# Patient Record
Sex: Female | Born: 1965 | Race: White | Hispanic: No | Smoking: Never smoker
Health system: Southern US, Community
[De-identification: ages and names within clinical notes are randomized; demographics above are authoritative.]

## PROBLEM LIST (undated history)

## (undated) DIAGNOSIS — I10 Essential (primary) hypertension: Secondary | ICD-10-CM

## (undated) DIAGNOSIS — R197 Diarrhea, unspecified: Secondary | ICD-10-CM

## (undated) DIAGNOSIS — Z85820 Personal history of malignant melanoma of skin: Secondary | ICD-10-CM

## (undated) DIAGNOSIS — E119 Type 2 diabetes mellitus without complications: Secondary | ICD-10-CM

## (undated) DIAGNOSIS — M810 Age-related osteoporosis without current pathological fracture: Secondary | ICD-10-CM

## (undated) DIAGNOSIS — Z0000 Encounter for general adult medical examination without abnormal findings: Secondary | ICD-10-CM

## (undated) DIAGNOSIS — R42 Dizziness and giddiness: Secondary | ICD-10-CM

## (undated) HISTORY — PX: GASTRIC BYPASS: SHX52

## (undated) HISTORY — PX: CYST EXCISION: SHX5701

---

## 2011-01-01 ENCOUNTER — Inpatient Hospital Stay (INDEPENDENT_AMBULATORY_CARE_PROVIDER_SITE_OTHER)
Admission: RE | Admit: 2011-01-01 | Discharge: 2011-01-01 | Disposition: A | Discharge status: Home / Self Care | Payer: Self-pay | Source: Ambulatory Visit | Attending: Emergency Medicine | Admitting: Emergency Medicine

## 2011-01-01 DIAGNOSIS — L03319 Cellulitis of trunk, unspecified: Secondary | ICD-10-CM

## 2011-01-01 DIAGNOSIS — N390 Urinary tract infection, site not specified: Secondary | ICD-10-CM

## 2011-01-01 LAB — POCT URINALYSIS DIP (DEVICE)
Bilirubin Urine: NEGATIVE
Glucose, UA: NEGATIVE mg/dL
Ketones, ur: NEGATIVE mg/dL
Nitrite: NEGATIVE
Specific Gravity, Urine: 1.015 (ref 1.005–1.030)
pH: 7 (ref 5.0–8.0)

## 2011-01-03 LAB — URINE CULTURE
Colony Count: >=100000
Culture  Setup Time: 201208251719

## 2011-05-30 ENCOUNTER — Telehealth (HOSPITAL_COMMUNITY): Payer: Self-pay | Admitting: *Deleted

## 2011-05-30 ENCOUNTER — Emergency Department (INDEPENDENT_AMBULATORY_CARE_PROVIDER_SITE_OTHER)
Admission: EM | Admit: 2011-05-30 | Discharge: 2011-05-30 | Disposition: A | Discharge status: Home / Self Care | Payer: Commercial Managed Care - PPO | Source: Home / Self Care | Attending: Family Medicine | Admitting: Family Medicine

## 2011-05-30 ENCOUNTER — Encounter (HOSPITAL_COMMUNITY): Payer: Self-pay | Admitting: *Deleted

## 2011-05-30 DIAGNOSIS — J329 Chronic sinusitis, unspecified: Secondary | ICD-10-CM

## 2011-05-30 HISTORY — DX: Essential (primary) hypertension: I10

## 2011-05-30 MED ORDER — FLUTICASONE PROPIONATE 50 MCG/ACT NA SUSP: 2.0000 | Freq: Every day | NASAL | Status: DC

## 2011-05-30 MED ORDER — AMOXICILLIN-POT CLAVULANATE 875-125 MG PO TABS: 1.0000 | ORAL_TABLET | Freq: Two times a day (BID) | ORAL | Status: AC

## 2011-05-30 NOTE — ED Provider Notes (Addendum)
History     CSN: [id number]  Arrival date & time 05/30/11  1111   First MD Initiated Contact with Patient 05/30/11 1300      Chief Complaint  Patient presents with  . Sinusitis    (Consider location/radiation/quality/duration/timing/severity/associated sxs/prior treatment) HPI Comments: Sinus headache and pressure x 10 days not responding to Dayquil. Hx of sinus infections. No fever. Green nasal discharge. No cough. Patient is a 46 y.o. female presenting with sinusitis. The history is provided by the patient.  Sinusitis  This is a new problem. The current episode started more than 1 week ago. The problem has not changed since onset.There has been no fever. Associated symptoms include congestion and sinus pressure. She has tried other medications and acetaminophen for the symptoms.    Past Medical History  Diagnosis Date  . Hypertension     Past Surgical History  Procedure Date  . Gastric bypass     History reviewed. No pertinent family history.  History  Substance Use Topics  . Smoking status: Not on file  . Smokeless tobacco: Not on file  . Alcohol Use:     OB History    Grav Para Term Preterm Abortions TAB SAB Ect Mult Living                  Review of Systems  Constitutional: Negative.   HENT: Positive for congestion and sinus pressure.   Eyes: Negative.   Respiratory: Negative.   Cardiovascular: Negative.   Gastrointestinal: Negative.   Genitourinary: Negative.   Musculoskeletal: Negative.   Skin: Negative.   Neurological: Negative.     Allergies  Aspirin  Home Medications   Current Outpatient Rx  Name Route Sig Dispense Refill  . LISINOPRIL 5 MG PO TABS Oral Take 5 mg by mouth daily.    Marland Kitchen FLUTICASONE PROPIONATE 50 MCG/ACT NA SUSP Nasal Place 2 sprays into the nose daily. 16 g 2    BP 159/82  Pulse 73  Temp(Src) 98.4 F (36.9 C) (Oral)  Resp 16  SpO2 100%  LMP 05/23/2011  Physical Exam  Nursing note and vitals  reviewed. Constitutional: She is oriented to person, place, and time. She appears well-developed and well-nourished.  HENT:  Head: Normocephalic and atraumatic.  Right Ear: Tympanic membrane is retracted.  Left Ear: Tympanic membrane is retracted.  Mouth/Throat: Uvula is midline, oropharynx is clear and moist and mucous membranes are normal.  Eyes: EOM are normal.  Neck: Normal range of motion.  Pulmonary/Chest: Effort normal and breath sounds normal. She has no wheezes. She has no rhonchi.  Musculoskeletal: Normal range of motion.  Neurological: She is alert and oriented to person, place, and time.  Skin: Skin is warm and dry.  Psychiatric: Her behavior is normal.    ED Course  Procedures (including critical care time)  Labs Reviewed - No data to display No results found.   1. Sinusitis       MDM  Given rx for fluticasone and Augmentin        Richardo Priest, MD 06/25/11 4098  Richardo Priest, MD 06/25/11 2036

## 2011-05-30 NOTE — ED Notes (Signed)
Pt  Reports  Symptoms  Of  A  Frontal  Headache  With  Sinus  Congestion  And  Nasal  stuffyness  Which  She  Reports  She has  Had  For  About 10  Days  She  Has  Taken otc  meds  With  No  releif

## 2011-10-03 ENCOUNTER — Emergency Department (INDEPENDENT_AMBULATORY_CARE_PROVIDER_SITE_OTHER)
Admission: EM | Admit: 2011-10-03 | Discharge: 2011-10-03 | Disposition: A | Discharge status: Home Health | Payer: Commercial Managed Care - PPO | Source: Home / Self Care | Attending: Emergency Medicine | Admitting: Emergency Medicine

## 2011-10-03 ENCOUNTER — Encounter (HOSPITAL_COMMUNITY): Payer: Self-pay | Admitting: Emergency Medicine

## 2011-10-03 DIAGNOSIS — J209 Acute bronchitis, unspecified: Secondary | ICD-10-CM

## 2011-10-03 DIAGNOSIS — J069 Acute upper respiratory infection, unspecified: Secondary | ICD-10-CM

## 2011-10-03 MED ORDER — AZITHROMYCIN 250 MG PO TABS: ORAL_TABLET | ORAL | Status: AC

## 2011-10-03 MED ORDER — BENZONATATE 200 MG PO CAPS: 200.0000 mg | ORAL_CAPSULE | Freq: Three times a day (TID) | ORAL | Status: AC | PRN

## 2011-10-03 MED ORDER — AMOXICILLIN 500 MG PO CAPS: 1000.0000 mg | ORAL_CAPSULE | Freq: Three times a day (TID) | ORAL | Status: DC

## 2011-10-03 MED ORDER — FLUTICASONE PROPIONATE 50 MCG/ACT NA SUSP: 2.0000 | Freq: Every day | NASAL | Status: DC

## 2011-10-03 NOTE — Discharge Instructions (Signed)

## 2011-10-03 NOTE — ED Provider Notes (Signed)
Chief Complaint  Patient presents with  . Cough    History of Present Illness:   The patient is a 46 year old female with a five-day history of nasal congestion, right ear congestion and pain, headache, and sinus pressure. She also has had sore throat, postnasal drip, cough productive of green sputum, tightness in her chest, generalized aching, and loose stools.  Review of Systems:  Other than noted above, the patient denies any of the following symptoms. Systemic:  No fever, chills, sweats, fatigue, myalgias, headache, or anorexia. Eye:  No redness, pain or drainage. ENT:  No earache, ear congestion, nasal congestion, sneezing, rhinorrhea, sinus pressure, sinus pain, post nasal drip, or sore throat. Lungs:  No cough, sputum production, wheezing, shortness of breath, or chest pain. GI:  No abdominal pain, nausea, vomiting, or diarrhea. Skin:  No rash or itching.  PMFSH:  Past medical history, family history, social history, meds, and allergies were reviewed.  Physical Exam:   Vital signs:  BP 143/98  Pulse 87  Temp(Src) 98.8 F (37.1 C) (Oral)  Resp 18  SpO2 97% General:  Alert, in no distress. Eye:  No conjunctival injection or drainage. Lids were normal. ENT:  TMs and canals were normal, without erythema or inflammation.  Nasal mucosa was clear and uncongested, without drainage.  Mucous membranes were moist.  Pharynx was clear, without exudate or drainage.  There were no oral ulcerations or lesions. Neck:  Supple, no adenopathy, tenderness or mass. Lungs:  No respiratory distress.  Lungs were clear to auscultation, without wheezes, rales or rhonchi.  Breath sounds were clear and equal bilaterally. Lungs were resonant to percussion.  No egophony. Heart:  Regular rhythm, without gallops, murmers or rubs. Skin:  Clear, warm, and dry, without rash or lesions.  Assessment:  The primary encounter diagnosis was Viral upper respiratory infection. A diagnosis of Acute bronchitis was also  pertinent to this visit.  Plan:   1.  The following meds were prescribed:   New Prescriptions   AZITHROMYCIN (ZITHROMAX Z-PAK) 250 MG TABLET    Take as directed.   BENZONATATE (TESSALON) 200 MG CAPSULE    Take 1 capsule (200 mg total) by mouth 3 (three) times daily as needed for cough.   FLUTICASONE (FLONASE) 50 MCG/ACT NASAL SPRAY    Place 2 sprays into the nose daily.   2.  The patient was instructed in symptomatic care and handouts were given. 3.  The patient was told to return if becoming worse in any way, if no better in 3 or 4 days, and given some red flag symptoms that would indicate earlier return.   Reuben Likes, MD 10/03/11 1414

## 2011-10-03 NOTE — ED Notes (Signed)
Pt c/o productive cough with green sputum.  Pt denies fever.

## 2013-06-08 ENCOUNTER — Encounter (HOSPITAL_COMMUNITY): Payer: Self-pay | Admitting: Emergency Medicine

## 2013-06-08 ENCOUNTER — Emergency Department (INDEPENDENT_AMBULATORY_CARE_PROVIDER_SITE_OTHER)
Admission: EM | Admit: 2013-06-08 | Discharge: 2013-06-08 | Disposition: A | Discharge status: Home / Self Care | Payer: Commercial Managed Care - PPO | Source: Home / Self Care | Attending: Family Medicine | Admitting: Family Medicine

## 2013-06-08 DIAGNOSIS — H8110 Benign paroxysmal vertigo, unspecified ear: Secondary | ICD-10-CM

## 2013-06-08 MED ORDER — MECLIZINE HCL 25 MG PO TABS: 25.0000 mg | ORAL_TABLET | Freq: Three times a day (TID) | ORAL | Status: DC | PRN

## 2013-06-08 NOTE — ED Provider Notes (Signed)
Virginia DienerLeona G Trang is a 48 y.o. female who presents to Urgent Care today for vertigo. This is been present for the last 2 days. Patient notes intense room spinning sensation that last between 30 and 60 minutes. This occurs with head motion. She denies any injury. The vertigo has been so severe that she has vomited several times. She has never had anything like this happen before. She denies any weakness or numbness loss of function or syncope. She denies any hearing change or ringing in her ears.   Past Medical History  Diagnosis Date  . Hypertension    History  Substance Use Topics  . Smoking status: Never Smoker   . Smokeless tobacco: Not on file  . Alcohol Use: No   ROS as above Medications: No current facility-administered medications for this encounter.   Current Outpatient Prescriptions  Medication Sig Dispense Refill  . lisinopril (PRINIVIL,ZESTRIL) 5 MG tablet Take 5 mg by mouth daily.      . meclizine (ANTIVERT) 25 MG tablet Take 1 tablet (25 mg total) by mouth 3 (three) times daily as needed for dizziness.  30 tablet  0  . [DISCONTINUED] fluticasone (FLONASE) 50 MCG/ACT nasal spray Place 2 sprays into the nose daily.  16 g  0    Exam:  BP 155/105  Pulse 100  Temp(Src) 97.7 F (36.5 C) (Oral)  Resp 15  SpO2 96% Gen: Well NAD HEENT: EOMI,  MMM PERRLA. Funduscopic exam is normal bilaterally. Lungs: Normal work of breathing. CTABL Heart: RRR no MRG Abd: NABS, Soft. NT, ND Exts: Brisk capillary refill, warm and well perfused.  Neuro: Alert and oriented Balance, coordination, cranial nerves II through XII are intact. Strength is intact throughout. Dix-Hallpike test is positive on the right with dizziness and nystagmus.   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 Plan to use meclizine, and refer to vestibular physical therapy.  Discussed warning signs or symptoms. Please see discharge instructions. Patient expresses understanding.    Rodolph BongEvan S Almer Littleton,  MD 06/08/13 [phone]

## 2013-06-08 NOTE — Discharge Instructions (Signed)
Thank you for coming in today. Please call  [phone] to get an appointment for vestibular rehab.  Take meclizine  every 8 hours as needed for dizziness. This will make you sleepy.  Benign Positional Vertigo Vertigo means you feel like you or your surroundings are moving when they are not. Benign positional vertigo is the most common form of vertigo. Benign means that the cause of your condition is not serious. Benign positional vertigo is more common in older adults. CAUSES  Benign positional vertigo is the result of an upset in the labyrinth system. This is an area in the middle ear that helps control your balance. This may be caused by a viral infection, head injury, or repetitive motion. However, often no specific cause is found. SYMPTOMS  Symptoms of benign positional vertigo occur when you move your head or eyes in different directions. Some of the symptoms may include:  Loss of balance and falls.  Vomiting.  Blurred vision.  Dizziness.  Nausea.  Involuntary eye movements (nystagmus). DIAGNOSIS  Benign positional vertigo is usually diagnosed by physical exam. If the specific cause of your benign positional vertigo is unknown, your caregiver may perform imaging tests, such as magnetic resonance imaging (MRI) or computed tomography (CT). TREATMENT  Your caregiver may recommend movements or procedures to correct the benign positional vertigo. Medicines such as meclizine, benzodiazepines, and medicines for nausea may be used to treat your symptoms. In rare cases, if your symptoms are caused by certain conditions that affect the inner ear, you may need surgery. HOME CARE INSTRUCTIONS   Follow your caregiver's instructions.  Move slowly. Do not make sudden body or head movements.  Avoid driving.  Avoid operating heavy machinery.  Avoid performing any tasks that would be dangerous to you or others during a vertigo episode.  Drink enough fluids to keep your urine clear or pale  yellow. SEEK IMMEDIATE MEDICAL CARE IF:   You develop problems with walking, weakness, numbness, or using your arms, hands, or legs.  You have difficulty speaking.  You develop severe headaches.  Your nausea or vomiting continues or gets worse.  You develop visual changes.  Your family or friends notice any behavioral changes.  Your condition gets worse.  You have a fever.  You develop a stiff neck or sensitivity to light. MAKE SURE YOU:   Understand these instructions.  Will watch your condition.  Will get help right away if you are not doing well or get worse. Document Released: 01/31/2006 Document Revised: 07/18/2011 Document Reviewed: 01/13/2011 [facility] Patient Information 2014 [employer], [employer].

## 2013-06-08 NOTE — ED Notes (Signed)
Pt c/o intermittent dizziness onset Friday Sxs will last for 5 minutes Denies: f/v, cold sxs Alert w/no signs of acute distress.

## 2013-06-14 ENCOUNTER — Ambulatory Visit: Payer: Commercial Managed Care - PPO | Attending: Family Medicine | Admitting: Physical Therapy

## 2013-06-14 DIAGNOSIS — IMO0001 Reserved for inherently not codable concepts without codable children: Secondary | ICD-10-CM | POA: Insufficient documentation

## 2013-06-14 DIAGNOSIS — H8110 Benign paroxysmal vertigo, unspecified ear: Secondary | ICD-10-CM | POA: Insufficient documentation

## 2013-06-18 ENCOUNTER — Ambulatory Visit: Payer: Commercial Managed Care - PPO | Admitting: Physical Therapy

## 2013-06-21 ENCOUNTER — Ambulatory Visit: Payer: Commercial Managed Care - PPO | Admitting: Physical Therapy

## 2013-07-03 ENCOUNTER — Encounter (HOSPITAL_COMMUNITY): Payer: Self-pay | Admitting: Emergency Medicine

## 2013-07-03 ENCOUNTER — Emergency Department (HOSPITAL_COMMUNITY)
Admission: EM | Admit: 2013-07-03 | Discharge: 2013-07-03 | Disposition: A | Discharge status: Home / Self Care | Payer: Commercial Managed Care - PPO | Attending: Emergency Medicine | Admitting: Emergency Medicine

## 2013-07-03 DIAGNOSIS — R112 Nausea with vomiting, unspecified: Secondary | ICD-10-CM | POA: Insufficient documentation

## 2013-07-03 DIAGNOSIS — Z9884 Bariatric surgery status: Secondary | ICD-10-CM | POA: Insufficient documentation

## 2013-07-03 DIAGNOSIS — I10 Essential (primary) hypertension: Secondary | ICD-10-CM | POA: Insufficient documentation

## 2013-07-03 DIAGNOSIS — R42 Dizziness and giddiness: Secondary | ICD-10-CM | POA: Insufficient documentation

## 2013-07-03 DIAGNOSIS — Z880 Allergy status to penicillin: Secondary | ICD-10-CM | POA: Insufficient documentation

## 2013-07-03 DIAGNOSIS — Z79899 Other long term (current) drug therapy: Secondary | ICD-10-CM | POA: Insufficient documentation

## 2013-07-03 DIAGNOSIS — R197 Diarrhea, unspecified: Secondary | ICD-10-CM | POA: Insufficient documentation

## 2013-07-03 HISTORY — DX: Dizziness and giddiness: R42

## 2013-07-03 LAB — CBC WITH DIFFERENTIAL/PLATELET
BASOS ABS: 0 10*3/uL (ref 0.0–0.1)
Basophils Relative: 0 % (ref 0–1)
Eosinophils Absolute: 0.1 10*3/uL (ref 0.0–0.7)
Eosinophils Relative: 2 % (ref 0–5)
HEMATOCRIT: 44.6 % (ref 36.0–46.0)
HEMOGLOBIN: 14.4 g/dL (ref 12.0–15.0)
LYMPHS PCT: 3 % — AB (ref 12–46)
Lymphs Abs: 0.3 10*3/uL — ABNORMAL LOW (ref 0.7–4.0)
MCH: 26.1 pg (ref 26.0–34.0)
MCHC: 32.3 g/dL (ref 30.0–36.0)
MCV: 80.9 fL (ref 78.0–100.0)
MONO ABS: 0.3 10*3/uL (ref 0.1–1.0)
MONOS PCT: 4 % (ref 3–12)
NEUTROS ABS: 7.9 10*3/uL — AB (ref 1.7–7.7)
Neutrophils Relative %: 92 % — ABNORMAL HIGH (ref 43–77)
Platelets: 218 10*3/uL (ref 150–400)
RBC: 5.51 MIL/uL — AB (ref 3.87–5.11)
RDW: 14.6 % (ref 11.5–15.5)
WBC: 8.7 10*3/uL (ref 4.0–10.5)

## 2013-07-03 LAB — URINALYSIS, ROUTINE W REFLEX MICROSCOPIC
Bilirubin Urine: NEGATIVE
GLUCOSE, UA: NEGATIVE mg/dL
KETONES UR: NEGATIVE mg/dL
LEUKOCYTES UA: NEGATIVE
Nitrite: NEGATIVE
PH: 6.5 (ref 5.0–8.0)
Protein, ur: 30 mg/dL — AB
Specific Gravity, Urine: 1.015 (ref 1.005–1.030)
Urobilinogen, UA: 0.2 mg/dL (ref 0.0–1.0)

## 2013-07-03 LAB — BASIC METABOLIC PANEL
BUN: 12 mg/dL (ref 6–23)
CHLORIDE: 103 meq/L (ref 96–112)
CO2: 28 meq/L (ref 19–32)
CREATININE: 1.05 mg/dL (ref 0.50–1.10)
Calcium: 9.1 mg/dL (ref 8.4–10.5)
GFR calc Af Amer: 71 mL/min — ABNORMAL LOW (ref >90)
GFR calc non Af Amer: 61 mL/min — ABNORMAL LOW (ref >90)
Glucose, Bld: 174 mg/dL — ABNORMAL HIGH (ref 70–99)
Potassium: 4.5 mEq/L (ref 3.7–5.3)
Sodium: 141 mEq/L (ref 137–147)

## 2013-07-03 LAB — URINE MICROSCOPIC-ADD ON

## 2013-07-03 MED ORDER — ONDANSETRON HCL 4 MG PO TABS: 4.0000 mg | ORAL_TABLET | Freq: Four times a day (QID) | ORAL | Status: DC

## 2013-07-03 MED ORDER — SODIUM CHLORIDE 0.9 % IV SOLN
Freq: Once | INTRAVENOUS | Status: AC
Start: 2013-07-03 — End: 2013-07-03
  Administered 2013-07-03: 11:00:00 via INTRAVENOUS

## 2013-07-03 MED ORDER — ONDANSETRON HCL 4 MG/2ML IJ SOLN
4.0000 mg | Freq: Once | INTRAMUSCULAR | Status: AC
  Administered 2013-07-03: 4 mg via INTRAVENOUS
  Filled 2013-07-03: qty 2

## 2013-07-03 MED ORDER — FAMOTIDINE IN NACL 20-0.9 MG/50ML-% IV SOLN
20.0000 mg | Freq: Once | INTRAVENOUS | Status: AC
  Administered 2013-07-03: 20 mg via INTRAVENOUS
  Filled 2013-07-03: qty 50

## 2013-07-03 NOTE — ED Notes (Signed)
Tammy PA at bedside,  

## 2013-07-03 NOTE — ED Notes (Addendum)
Pt c/o n/v/d since this am. Mm moist. nad at this time. States has had mucus diarrhea since September but diarrhea that started today is different and just watery. Denies black or bloody emesis but "a little" black in stool yesterday per pt. C/o some lightheadedness this am but not at present.

## 2013-07-03 NOTE — ED Provider Notes (Signed)
CSN: [id number]     Arrival date & time 07/03/13  0932 History   First MD Initiated Contact with Patient 07/03/13 0940     Chief Complaint  Patient presents with  . Emesis  . Diarrhea     (Consider location/radiation/quality/duration/timing/severity/associated sxs/prior Treatment) Patient is a 48 y.o. female presenting with vomiting and diarrhea. The history is provided by the patient.  Emesis Severity:  Mild Duration:  6 hours Timing:  Constant Number of daily episodes:  2 Quality:  Bilious material and stomach contents Feeding tolerance: unable to tolerate foods or liquids. Progression:  Worsening Chronicity:  New Recent urination:  Normal Worsened by:  Nothing tried Ineffective treatments:  None tried Associated symptoms: diarrhea   Associated symptoms: no abdominal pain, no arthralgias, no chills, no cough, no fever, no headaches, no myalgias, no sore throat and no URI   Diarrhea:    Quality:  Watery   Number of occurrences:  3   Severity:  Moderate   Duration:  6 hours   Timing:  Intermittent   Progression:  Unchanged Risk factors: no diabetes, not pregnant now, no sick contacts, no suspect food intake and no travel to endemic areas   Risk factors comment:  Denies know sic contacts, but has exposure to general public Diarrhea Associated symptoms: vomiting   Associated symptoms: no abdominal pain, no arthralgias, no chills, no recent cough, no fever, no headaches, no myalgias and no URI     Past Medical History  Diagnosis Date  . Hypertension   . Vertigo    Past Surgical History  Procedure Laterality Date  . Gastric bypass     History reviewed. No pertinent family history. History  Substance Use Topics  . Smoking status: Never Smoker   . Smokeless tobacco: Not on file  . Alcohol Use: No   OB History   Grav Para Term Preterm Abortions TAB SAB Ect Mult Living                 Review of Systems  Constitutional: Negative for fever, chills and appetite  change.  HENT: Negative for sore throat.   Respiratory: Negative for chest tightness and shortness of breath.   Cardiovascular: Negative for chest pain.  Gastrointestinal: Positive for nausea, vomiting and diarrhea. Negative for abdominal pain, blood in stool and abdominal distention.  Genitourinary: Negative for dysuria, hematuria, flank pain, decreased urine volume and difficulty urinating.  Musculoskeletal: Negative for arthralgias, back pain and myalgias.  Skin: Negative for color change and rash.  Neurological: Negative for dizziness, weakness, numbness and headaches.  Hematological: Negative for adenopathy.  All other systems reviewed and are negative.      Allergies  Aspirin and Penicillins  Home Medications   Current Outpatient Rx  Name  Route  Sig  Dispense  Refill  . lisinopril (PRINIVIL,ZESTRIL) 5 MG tablet   Oral   Take 5 mg by mouth daily.         . meclizine (ANTIVERT) 25 MG tablet   Oral   Take 1 tablet (25 mg total) by mouth 3 (three) times daily as needed for dizziness.   30 tablet   0    BP 126/92  Pulse 110  Temp(Src) 98.1 F (36.7 C) (Oral)  Resp 21  SpO2 100%  LMP 07/03/2013 Physical Exam  Nursing note and vitals reviewed. Constitutional: She is oriented to person, place, and time. She appears well-developed and well-nourished. No distress.  HENT:  Head: Normocephalic and atraumatic.  Mouth/Throat: Uvula is midline,  oropharynx is clear and moist and mucous membranes are normal.  Eyes: EOM are normal. Pupils are equal, round, and reactive to light. No scleral icterus.  Cardiovascular: Normal rate, regular rhythm, normal heart sounds and intact distal pulses.   No murmur heard. Pulmonary/Chest: Effort normal and breath sounds normal. No respiratory distress.  Abdominal: Soft. Bowel sounds are normal. She exhibits no distension and no mass. There is no tenderness. There is no rebound and no guarding.  Musculoskeletal: Normal range of motion. She  exhibits no edema.  Neurological: She is alert and oriented to person, place, and time. She exhibits normal muscle tone. Coordination normal.  Skin: Skin is warm and dry.    ED Course  Procedures (including critical care time) Labs Review Labs Reviewed  CBC WITH DIFFERENTIAL - Abnormal; Notable for the following:    RBC 5.51 (*)    Neutrophils Relative % 92 (*)    Neutro Abs 7.9 (*)    Lymphocytes Relative 3 (*)    Lymphs Abs 0.3 (*)    All other components within normal limits  BASIC METABOLIC PANEL - Abnormal; Notable for the following:    Glucose, Bld 174 (*)    GFR calc non Af Amer 61 (*)    GFR calc Af Amer 71 (*)    All other components within normal limits  URINALYSIS, ROUTINE W REFLEX MICROSCOPIC - Abnormal; Notable for the following:    Hgb urine dipstick MODERATE (*)    Protein, ur 30 (*)    All other components within normal limits  URINE MICROSCOPIC-ADD ON - Abnormal; Notable for the following:    Squamous Epithelial / LPF FEW (*)    All other components within normal limits  URINE CULTURE   Imaging Review No results found.  EKG Interpretation   None       MDM   Final diagnoses:  Nausea vomiting and diarrhea    patient is well appearing, mucous membranes are moist.  VSS.    Hematuria is likely contaminant, pt is menstruating.  Labs reviewed and discussed with pt.  She has received IVF's, anti-emetic and she is feeling better, abd remains soft , NT.  No concerning sx's for acute abdomen.  Likely viral illlness  Urine culture pending.    Vital improved.  Pt ambulated to restroom w/o difficulty.   The patient appears reasonably screened and/or stabilized for discharge and I doubt any other medical condition or other [facility] requiring further screening, evaluation, or treatment in the ED at this time prior to discharge.   Edla Para L. Trisha Mangle, PA-C 07/04/13 1546

## 2013-07-03 NOTE — Discharge Instructions (Signed)
Diet for Diarrhea, Adult °Frequent, runny stools (diarrhea) may be caused or worsened by food or drink. Diarrhea may be relieved by changing your diet. Since diarrhea can last up to 7 days, it is easy for you to lose too much fluid from the body and become dehydrated. Fluids that are lost need to be replaced. Along with a modified diet, make sure you drink enough fluids to keep your urine clear or pale yellow. °DIET INSTRUCTIONS °· Ensure adequate fluid intake (hydration): have 1 cup (8 oz) of fluid for each diarrhea episode. Avoid fluids that contain simple sugars or sports drinks, fruit juices, whole milk products, and sodas. Your urine should be clear or pale yellow if you are drinking enough fluids. Hydrate with an oral rehydration solution that you can purchase at pharmacies, retail stores, and online. You can prepare an oral rehydration solution at home by mixing the following ingredients together: °·   tsp table salt. °· ¾ tsp baking soda. °·  tsp salt substitute containing potassium chloride. °· 1  tablespoons sugar. °· 1 L (34 oz) of water. °· Certain foods and beverages may increase the speed at which food moves through the gastrointestinal (GI) tract. These foods and beverages should be avoided and include: °· Caffeinated and alcoholic beverages. °· High-fiber foods, such as raw fruits and vegetables, nuts, seeds, and whole grain breads and cereals. °· Foods and beverages sweetened with sugar alcohols, such as xylitol, sorbitol, and mannitol. °· Some foods may be well tolerated and may help thicken stool including: °· Starchy foods, such as rice, toast, pasta, low-sugar cereal, oatmeal, grits, baked potatoes, crackers, and bagels.   °· Bananas.   °· Applesauce. °· Add probiotic-rich foods to help increase healthy bacteria in the GI tract, such as yogurt and fermented milk products. °RECOMMENDED FOODS AND BEVERAGES °Starches °Choose foods with less than 2 g of fiber per serving. °· Recommended:  White,  French, and pita breads, plain rolls, buns, bagels. Plain muffins, matzo. Soda, saltine, or graham crackers. Pretzels, melba toast, zwieback. Cooked cereals made with water: cornmeal, farina, cream cereals. Dry cereals: refined corn, wheat, rice. Potatoes prepared any way without skins, refined macaroni, spaghetti, noodles, refined rice. °· Avoid:  Bread, rolls, or crackers made with whole wheat, multi-grains, rye, bran seeds, nuts, or coconut. Corn tortillas or taco shells. Cereals containing whole grains, multi-grains, bran, coconut, nuts, raisins. Cooked or dry oatmeal. Coarse wheat cereals, granola. Cereals advertised as "high-fiber." Potato skins. Whole grain pasta, wild or brown rice. Popcorn. Sweet potatoes, yams. Sweet rolls, doughnuts, waffles, pancakes, sweet breads. °Vegetables °· Recommended: Strained tomato and vegetable juices. Most well-cooked and canned vegetables without seeds. Fresh: Tender lettuce, cucumber without the skin, cabbage, spinach, bean sprouts. °· Avoid: Fresh, cooked, or canned: Artichokes, baked beans, beet greens, broccoli, Brussels sprouts, corn, kale, legumes, peas, sweet potatoes. Cooked: Green or red cabbage, spinach. Avoid large servings of any vegetables because vegetables shrink when cooked, and they contain more fiber per serving than fresh vegetables. °Fruit °· Recommended: Cooked or canned: Apricots, applesauce, cantaloupe, cherries, fruit cocktail, grapefruit, grapes, kiwi, mandarin oranges, peaches, pears, plums, watermelon. Fresh: Apples without skin, ripe banana, grapes, cantaloupe, cherries, grapefruit, peaches, oranges, plums. Keep servings limited to ½ cup or 1 piece. °· Avoid: Fresh: Apples with skin, apricots, mangoes, pears, raspberries, strawberries. Prune juice, stewed or dried prunes. Dried fruits, raisins, dates. Large servings of all fresh fruits. °Protein °· Recommended: Ground or well-cooked tender beef, ham, veal, lamb, pork, or poultry. Eggs. Fish,  oysters, shrimp,   lobster, other seafoods. Liver, organ meats. °· Avoid: Tough, fibrous meats with gristle. Peanut butter, smooth or chunky. Cheese, nuts, seeds, legumes, dried peas, beans, lentils. °Dairy °· Recommended: Yogurt, lactose-free milk, kefir, drinkable yogurt, buttermilk, soy milk, or plain hard cheese. °· Avoid: Milk, chocolate milk, beverages made with milk, such as milkshakes. °Soups °· Recommended: Bouillon, broth, or soups made from allowed foods. Any strained soup. °· Avoid: Soups made from vegetables that are not allowed, cream or milk-based soups. °Desserts and Sweets °· Recommended: Sugar-free gelatin, sugar-free frozen ice pops made without sugar alcohol. °· Avoid: Plain cakes and cookies, pie made with fruit, pudding, custard, cream pie. Gelatin, fruit, ice, sherbet, frozen ice pops. Ice cream, ice milk without nuts. Plain hard candy, honey, jelly, molasses, syrup, sugar, chocolate syrup, gumdrops, marshmallows. °Fats and Oils °· Recommended: Limit fats to less than 8 tsp per day. °· Avoid: Seeds, nuts, olives, avocados. Margarine, butter, cream, mayonnaise, salad oils, plain salad dressings. Plain gravy, crisp bacon without rind. °Beverages °· Recommended: Water, decaffeinated teas, oral rehydration solutions, sugar-free beverages not sweetened with sugar alcohols. °· Avoid: Fruit juices, caffeinated beverages (coffee, tea, soda), alcohol, sports drinks, or lemon-lime soda. °Condiments °· Recommended: Ketchup, mustard, horseradish, vinegar, cocoa powder. Spices in moderation: allspice, basil, bay leaves, celery powder or leaves, cinnamon, cumin powder, curry powder, ginger, mace, marjoram, onion or garlic powder, oregano, paprika, parsley flakes, ground pepper, rosemary, sage, savory, tarragon, thyme, turmeric. °· Avoid: Coconut, honey. °Document Released: 07/16/2003 Document Revised: 01/18/2012 Document Reviewed: 09/09/2011 °ExitCare® Patient Information ©2014 [employer]. ° °Nausea and  Vomiting °Nausea means you feel sick to your stomach. Throwing up (vomiting) is a reflex where stomach contents come out of your mouth. °HOME CARE  °· Take medicine as told by your doctor. °· Do not force yourself to eat. However, you do need to drink fluids. °· If you feel like eating, eat a normal diet as told by your doctor. °· Eat rice, wheat, potatoes, bread, lean meats, yogurt, fruits, and vegetables. °· Avoid high-fat foods. °· Drink enough fluids to keep your pee (urine) clear or pale yellow. °· Ask your doctor how to replace body fluid losses (rehydrate). Signs of body fluid loss (dehydration) include: °· Feeling very thirsty. °· Dry lips and mouth. °· Feeling dizzy. °· Dark pee. °· Peeing less than normal. °· Feeling confused. °· Fast breathing or heart rate. °GET HELP RIGHT AWAY IF:  °· You have blood in your throw up. °· You have black or bloody poop (stool). °· You have a bad headache or stiff neck. °· You feel confused. °· You have bad belly (abdominal) pain. °· You have chest pain or trouble breathing. °· You do not pee at least once every 8 hours. °· You have cold, clammy skin. °· You keep throwing up after 24 to 48 hours. °· You have a fever. °MAKE SURE YOU:  °· Understand these instructions. °· Will watch your condition. °· Will get help right away if you are not doing well or get worse. °Document Released: 10/12/2007 Document Revised: 07/18/2011 Document Reviewed: 09/24/2010 °ExitCare® Patient Information ©2014 [employer]. ° °

## 2013-07-04 NOTE — ED Provider Notes (Signed)
Medical screening examination/treatment/procedure(s) were performed by non-physician practitioner and as supervising physician I was immediately available for consultation/collaboration.  EKG Interpretation   None         Daunte Oestreich M Makya Yurko, DO 07/04/13 1849 

## 2013-07-05 LAB — URINE CULTURE

## 2013-07-06 ENCOUNTER — Telehealth (HOSPITAL_COMMUNITY): Payer: Self-pay | Admitting: *Deleted

## 2013-07-06 NOTE — ED Notes (Signed)
Patient informed or positve results of Urine culture after id'd x 2.Chart reviewed by Trixie DredgeEmily West and [phone].Rx for Cipro 250 mg po BID x 3 days called by Sander NephewShannon Gammon PFM

## 2013-07-06 NOTE — Progress Notes (Signed)
ED Antimicrobial Stewardship Positive Culture Follow Up   Virginia DienerLeona G Jordan is an 48 y.o. female who [facility] with a chief complaint of  Chief Complaint  Patient presents with  . Emesis  . Diarrhea    Recent Results (from the past 720 hour(s))  URINE CULTURE     Status: None   Collection Time    07/03/13 10:12 AM      Result Value Ref Range Status   Specimen Description URINE, CLEAN CATCH   Final   Special Requests NONE   Final   Culture  Setup Time     Final   Value: 07/04/2013 01:26     Performed at [facility]   Colony Count     Final   Value: >=100,000 COLONIES/ML     Performed at [facility]   Culture     Final   Value: KLEBSIELLA OXYTOCA     Performed at [facility]   Report Status 07/05/2013 FINAL   Final   Organism ID, Bacteria KLEBSIELLA OXYTOCA   Final    []  Treated with no antimicrobials, organism resistant to prescribed antimicrobial [x]  Patient discharged originally without antimicrobial agent and treatment is now indicated  New antibiotic prescription: ciprofloxacin 250mg  PO BID x 3 days  ED Provider: Trixie DredgeEmily West, PA  Keiera Strathman, Drake Leachachel Lynn 07/06/2013, 2:33 PM Clinical Pharmacist Phone# [phone]

## 2013-08-28 LAB — LIPID PANEL
CHOLESTEROL: 168 mg/dL (ref 0–200)
HDL: 47 mg/dL (ref 35–70)
LDL CALC: 108 mg/dL
LDL/HDL RATIO: 3.6
TRIGLYCERIDES: 63 mg/dL (ref 40–160)

## 2013-08-28 LAB — TSH: TSH: 3.1 u[IU]/mL (ref <=5.90)

## 2013-08-28 LAB — HEMOGLOBIN A1C: Hgb A1c MFr Bld: 5.7 % (ref 4.0–6.0)

## 2013-08-28 LAB — CBC AND DIFFERENTIAL: WBC: 6.2 10^3/mL

## 2013-09-11 ENCOUNTER — Other Ambulatory Visit (HOSPITAL_COMMUNITY): Payer: Self-pay | Admitting: Internal Medicine

## 2013-09-11 DIAGNOSIS — Z139 Encounter for screening, unspecified: Secondary | ICD-10-CM

## 2013-09-12 ENCOUNTER — Ambulatory Visit (HOSPITAL_COMMUNITY)
Admission: RE | Admit: 2013-09-12 | Discharge: 2013-09-12 | Disposition: A | Discharge status: Home / Self Care | Payer: Commercial Managed Care - PPO | Source: Ambulatory Visit | Attending: Internal Medicine | Admitting: Internal Medicine

## 2013-09-12 DIAGNOSIS — Z139 Encounter for screening, unspecified: Secondary | ICD-10-CM

## 2013-09-12 DIAGNOSIS — Z1231 Encounter for screening mammogram for malignant neoplasm of breast: Secondary | ICD-10-CM | POA: Insufficient documentation

## 2013-10-03 ENCOUNTER — Telehealth: Payer: Self-pay

## 2013-10-03 NOTE — Telephone Encounter (Signed)
Pt was referred by Dr. Margo Aye for screening colonoscopy. Asking for ASAP due to her work requirements.  She will not be 48 until 12/2013.  She is checking her insurance and will call me back.

## 2013-10-07 ENCOUNTER — Encounter: Payer: Self-pay | Admitting: *Deleted

## 2013-10-07 DIAGNOSIS — E559 Vitamin D deficiency, unspecified: Secondary | ICD-10-CM | POA: Insufficient documentation

## 2013-10-07 DIAGNOSIS — R7303 Prediabetes: Secondary | ICD-10-CM

## 2013-10-07 DIAGNOSIS — E669 Obesity, unspecified: Secondary | ICD-10-CM | POA: Insufficient documentation

## 2013-10-08 ENCOUNTER — Other Ambulatory Visit (HOSPITAL_COMMUNITY)
Admission: RE | Admit: 2013-10-08 | Discharge: 2013-10-08 | Disposition: A | Discharge status: Home / Self Care | Payer: Commercial Managed Care - PPO | Source: Ambulatory Visit | Attending: Obstetrics and Gynecology | Admitting: Obstetrics and Gynecology

## 2013-10-08 ENCOUNTER — Encounter: Payer: Self-pay | Admitting: Obstetrics and Gynecology

## 2013-10-08 ENCOUNTER — Ambulatory Visit (INDEPENDENT_AMBULATORY_CARE_PROVIDER_SITE_OTHER): Payer: 59 | Admitting: Obstetrics and Gynecology

## 2013-10-08 VITALS — BP 126/88 | Ht 64.0 in | Wt 267.8 lb

## 2013-10-08 DIAGNOSIS — Z1151 Encounter for screening for human papillomavirus (HPV): Secondary | ICD-10-CM | POA: Insufficient documentation

## 2013-10-08 DIAGNOSIS — Z1212 Encounter for screening for malignant neoplasm of rectum: Secondary | ICD-10-CM

## 2013-10-08 DIAGNOSIS — Z01419 Encounter for gynecological examination (general) (routine) without abnormal findings: Secondary | ICD-10-CM

## 2013-10-08 NOTE — Progress Notes (Signed)
Patient ID: KAYLYN MILKEY, female   DOB: Dec 29, 1965, 48 y.o.   MRN: [id number]     Assessment:  Annual Gyn Exam   Plan:  1. pap smear done, next pap due 3 yr 2. return annually or prn 3    Annual mammogram advised 4   WT loss strongly encouraged Subjective:  JOSHLYNN MOOS is a 48 y.o. female No obstetric history on file. who presents for annual exam. Patient's last menstrual period was 08/26/2013. The patient has complaints today of increasing SUI with cough, ADL  The following portions of the patient's history were reviewed and updated as appropriate: allergies, current medications, past family history, past medical history, past social history, past surgical history and problem list.  Review of Systems Constitutional: obesity inactivity hx 100lb wt loss p bariatric surgery,  Gastrointestinal: has gained wt back "after divorce" Genitourinary: +sui  Objective:  BP 126/88  Ht 5\' 4"  (1.626 m)  Wt 267 lb 12.8 oz (121.473 kg)  BMI 45.95 kg/m2  LMP 08/26/2013   BMI: Body mass index is 45.95 kg/(m^2).  General Appearance: Alert, appropriate appearance for age. No acute distress HEENT: Grossly normal Neck / Thyroid:  Cardiovascular: RRR; normal S1, S2, no murmur Lungs: CTA bilaterally Back: No CVAT Breast Exam: No masses or nodes.No dimpling, nipple retraction or discharge. Gastrointestinal: Soft, non-tender, no masses or organomegaly Pelvic Exam: Vulva and vagina appear normal. Bimanual exam reveals normal uterus and adnexa. Rectovaginal: normal rectal, no masses and guaiac negative stool obtained Lymphatic Exam: Non-palpable nodes in neck, clavicular, axillary, or inguinal regions Skin: no rash or abnormalities Neurologic: Normal gait and speech, no tremor  Psychiatric: Alert and oriented, appropriate affect.  Urinalysis:Not done  Christin Bach. MD Pgr [phone] 5:02 PM

## 2013-10-14 LAB — HEMOCCULT GUIAC POC 1CARD (OFFICE): Fecal Occult Blood, POC: NEGATIVE

## 2013-10-14 LAB — CYTOLOGY - PAP

## 2013-10-16 ENCOUNTER — Telehealth: Payer: Self-pay

## 2013-10-16 NOTE — Telephone Encounter (Signed)
Pt called today to be triage and get her first colonoscopy done ASAP due to her schedule with the [employer].  She said that she would be available this week or next week. Dr Margo Aye sent a referral to have this done. Please call her cell number [phone]

## 2013-10-17 ENCOUNTER — Other Ambulatory Visit: Payer: Self-pay

## 2013-10-17 ENCOUNTER — Telehealth: Payer: Self-pay

## 2013-10-17 DIAGNOSIS — Z1211 Encounter for screening for malignant neoplasm of colon: Secondary | ICD-10-CM

## 2013-10-17 NOTE — Telephone Encounter (Signed)
See triage started on 10/17/2013.

## 2013-10-17 NOTE — Telephone Encounter (Signed)
PT called to schedule her colonoscopy.   She is 48 years old and will turn 25  In August 2015.   She said she has checked with her insurance and they said ok to schedule since this is the year that she will be 48 and she has already recived info in the mail to get it scheduled.

## 2013-10-17 NOTE — Telephone Encounter (Signed)
See triage started 10/17/2013.

## 2013-10-17 NOTE — Telephone Encounter (Signed)
LMOM for a return call to complete triage and schedule colonoscopy.

## 2013-10-22 NOTE — Telephone Encounter (Signed)
Ok to schedule.

## 2013-10-22 NOTE — Telephone Encounter (Signed)
Gastroenterology Pre-Procedure Review  Request Date: 10/17/2013 Requesting Physician: Dr. Dwana MelenaZack Jordan  This will be pt's first colonoscopy/ she checked with her insurance and since this is the year she will be 48 they said ok to do it now  PATIENT REVIEW QUESTIONS: The patient responded to the following health history questions as indicated:    1. Diabetes Melitis: no 2. Joint replacements in the past 12 months: no 3. Major health problems in the past 3 months: no 4. Has an artificial valve or MVP: no 5. Has a defibrillator: no 6. Has been advised in past to take antibiotics in advance of a procedure like teeth cleaning: no    MEDICATIONS & ALLERGIES:    Patient reports the following regarding taking any blood thinners:   Plavix? no Aspirin? no Coumadin? no  Patient confirms/reports the following medications:  Current Outpatient Prescriptions  Medication Sig Dispense Refill  . fluconazole (DIFLUCAN) 100 MG tablet Pt is taking one a day for 14 days due to a problem under her arm      . lisinopril (PRINIVIL,ZESTRIL) 40 MG tablet Take 40 mg by mouth daily.      . NON FORMULARY Probiotic   One daily      . Phentermine-Topiramate (QSYMIA) 7.5-46 MG CP24 Take by mouth. Pt takes one tablet daily      . Vitamin D, Ergocalciferol, (DRISDOL) 50000 UNITS CAPS capsule Take 50,000 Units by mouth every 7 (seven) days. Takes 1.25 mg once weekly      . ondansetron (ZOFRAN) 4 MG tablet Take 1 tablet (4 mg total) by mouth every 6 (six) hours.  12 tablet  0  . [DISCONTINUED] fluticasone (FLONASE) 50 MCG/ACT nasal spray Place 2 sprays into the nose daily.  16 g  0   No current facility-administered medications for this visit.    Patient confirms/reports the following allergies:  Allergies  Allergen Reactions  . Aspirin Hives  . Morphine And Related Other (See Comments)    Patient states" the Dr told me not to take it because it drops my blood pressure".   . Penicillins Hives    No orders of the  defined types were placed in this encounter.    AUTHORIZATION INFORMATION Primary Insurance:   ID #:   Group #:  Pre-Cert / Auth required:  Pre-Cert / Auth #:   Secondary Insurance:   ID #:  Group #:  Pre-Cert / Auth required:  Pre-Cert / Auth #:   SCHEDULE INFORMATION: Procedure has been scheduled as follows:  Date:  11/21/2013                  Time:  10:30 AM Location: [facility] Short Stay  This Gastroenterology Pre-Precedure Review Form is being routed to the following Hana Trippett(s): R. Roetta SessionsMichael Rourk, MD

## 2013-10-28 ENCOUNTER — Encounter: Payer: Self-pay | Admitting: Internal Medicine

## 2013-10-28 MED ORDER — PEG-KCL-NACL-NASULF-NA ASC-C 100 G PO SOLR: 1.0000 | ORAL | Status: DC

## 2013-10-28 NOTE — Telephone Encounter (Signed)
Rx sent to the pharmacy and instructions mailed to the pharmacy.

## 2013-11-06 ENCOUNTER — Encounter (HOSPITAL_COMMUNITY): Payer: Self-pay | Admitting: Pharmacy Technician

## 2013-11-19 ENCOUNTER — Other Ambulatory Visit: Payer: Self-pay | Admitting: Internal Medicine

## 2013-11-19 ENCOUNTER — Telehealth: Payer: Self-pay | Admitting: Internal Medicine

## 2013-11-19 MED ORDER — PEG-KCL-NACL-NASULF-NA ASC-C 100 G PO SOLR: 1.0000 | ORAL | Status: DC

## 2013-11-19 NOTE — Telephone Encounter (Signed)
Pt is schedule for tcs on Thursday and [facility]. Can we check on that or call it back in for her. I told her that DS wasn't here, but she was rather anxious about getting this taken care of today.

## 2013-11-19 NOTE — Telephone Encounter (Signed)
[facility]

## 2013-11-21 ENCOUNTER — Ambulatory Visit (HOSPITAL_COMMUNITY)
Admission: RE | Admit: 2013-11-21 | Discharge: 2013-11-21 | Disposition: A | Discharge status: Home / Self Care | Payer: 59 | Source: Ambulatory Visit | Attending: Internal Medicine | Admitting: Internal Medicine

## 2013-11-21 ENCOUNTER — Encounter (HOSPITAL_COMMUNITY): Payer: Self-pay | Admitting: *Deleted

## 2013-11-21 ENCOUNTER — Encounter (HOSPITAL_COMMUNITY)
Admission: RE | Disposition: A | Discharge status: Home / Self Care | Payer: Self-pay | Source: Ambulatory Visit | Attending: Internal Medicine

## 2013-11-21 DIAGNOSIS — I10 Essential (primary) hypertension: Secondary | ICD-10-CM | POA: Diagnosis not present

## 2013-11-21 DIAGNOSIS — F17200 Nicotine dependence, unspecified, uncomplicated: Secondary | ICD-10-CM | POA: Insufficient documentation

## 2013-11-21 DIAGNOSIS — Z9884 Bariatric surgery status: Secondary | ICD-10-CM | POA: Insufficient documentation

## 2013-11-21 DIAGNOSIS — Z79899 Other long term (current) drug therapy: Secondary | ICD-10-CM | POA: Diagnosis not present

## 2013-11-21 DIAGNOSIS — Z1211 Encounter for screening for malignant neoplasm of colon: Secondary | ICD-10-CM | POA: Diagnosis present

## 2013-11-21 HISTORY — PX: COLONOSCOPY: SHX5424

## 2013-11-21 SURGERY — COLONOSCOPY
Anesthesia: Moderate Sedation

## 2013-11-21 MED ORDER — ONDANSETRON HCL 4 MG/2ML IJ SOLN
INTRAMUSCULAR | Status: AC
  Filled 2013-11-21: qty 2

## 2013-11-21 MED ORDER — STERILE WATER FOR IRRIGATION IR SOLN
Status: DC | PRN
  Administered 2013-11-21: 10:00:00

## 2013-11-21 MED ORDER — MEPERIDINE HCL 100 MG/ML IJ SOLN
INTRAMUSCULAR | Status: AC
  Filled 2013-11-21: qty 2

## 2013-11-21 MED ORDER — MIDAZOLAM HCL 5 MG/5ML IJ SOLN
INTRAMUSCULAR | Status: AC
  Filled 2013-11-21: qty 10

## 2013-11-21 MED ORDER — ONDANSETRON HCL 4 MG/2ML IJ SOLN
INTRAMUSCULAR | Status: DC | PRN
  Administered 2013-11-21: 4 mg via INTRAVENOUS

## 2013-11-21 MED ORDER — MEPERIDINE HCL 100 MG/ML IJ SOLN
INTRAMUSCULAR | Status: DC | PRN
  Administered 2013-11-21: 25 mg via INTRAVENOUS
  Administered 2013-11-21: 50 mg via INTRAVENOUS

## 2013-11-21 MED ORDER — SODIUM CHLORIDE 0.9 % IV SOLN
INTRAVENOUS | Status: DC
  Administered 2013-11-21: 10:00:00 via INTRAVENOUS

## 2013-11-21 MED ORDER — MIDAZOLAM HCL 5 MG/5ML IJ SOLN
INTRAMUSCULAR | Status: DC | PRN
  Administered 2013-11-21: 1 mg via INTRAVENOUS
  Administered 2013-11-21: 2 mg via INTRAVENOUS
  Administered 2013-11-21 (×2): 1 mg via INTRAVENOUS

## 2013-11-21 NOTE — H&P (Signed)
$'@LOGO'O$ @   Primary Care Physician:  Delphina Cahill, MD Primary Gastroenterologist:  Dr. Gala Romney  Pre-Procedure History & Physical: HPI:  Virginia Jordan is a 48 y.o. female is here for a screening colonoscopy. No bowel symptoms. No family history of colon cancer. No prior colonoscopy.  Past Medical History  Diagnosis Date  . Hypertension   . Vertigo     Past Surgical History  Procedure Laterality Date  . Gastric bypass    . Cyst excision      rt wrist, rt foot    Prior to Admission medications   Medication Sig Start Date End Date Taking? Authorizing Provider  lisinopril (PRINIVIL,ZESTRIL) 40 MG tablet Take 40 mg by mouth daily.   Yes Historical Provider, MD  NON FORMULARY Probiotic   One daily   Yes Historical Provider, MD  peg 3350 powder (MOVIPREP) 100 G SOLR Take 1 kit (200 g total) by mouth as directed. 11/19/13  Yes Daneil Dolin, MD  Phentermine-Topiramate (QSYMIA) 7.5-46 MG CP24 Take by mouth. Pt takes one tablet daily   Yes Historical Provider, MD  Vitamin D, Ergocalciferol, (DRISDOL) 50000 UNITS CAPS capsule Take 50,000 Units by mouth every 7 (seven) days. Takes 1.25 mg once weekly   Yes Historical Provider, MD    Allergies as of 10/17/2013 - Review Complete 10/17/2013  Allergen Reaction Noted  . Aspirin Hives 05/30/2011  . Morphine and related Other (See Comments) 07/03/2013  . Penicillins Hives 06/08/2013    Family History  Problem Relation Age of Onset  . Diabetes Mother   . Heart disease Mother   . Early death Father   . Colon cancer Neg Hx   . Colon polyps Neg Hx     History   Social History  . Marital Status: Married    Spouse Name: N/A    Number of Children: N/A  . Years of Education: N/A   Occupational History  . Not on file.   Social History Main Topics  . Smoking status: Never Smoker   . Smokeless tobacco: Not on file  . Alcohol Use: No  . Drug Use: No  . Sexual Activity: No   Other Topics Concern  . Not on file   Social History  Narrative  . No narrative on file    Review of Systems: See HPI, otherwise negative ROS  Physical Exam: BP 151/77  Pulse 74  Temp(Src) 97.4 F (36.3 C) (Oral)  Resp 10  Ht $R'5\' 4"'XS$  (1.626 m)  Wt 270 lb (122.471 kg)  BMI 46.32 kg/m2  SpO2 100%  LMP 11/07/2013 General:   Alert,  Well-developed, well-nourished, pleasant and cooperative in NAD Head:  Normocephalic and atraumatic. Eyes:  Sclera clear, no icterus.   Conjunctiva pink. Ears:  Normal auditory acuity. Nose:  No deformity, discharge,  or lesions. Mouth:  No deformity or lesions, dentition normal. Neck:  Supple; no masses or thyromegaly. Lungs:  Clear throughout to auscultation.   No wheezes, crackles, or rhonchi. No acute distress. Heart:  Regular rate and rhythm; no murmurs, clicks, rubs,  or gallops. Abdomen:  Soft, nontender and nondistended. No masses, hepatosplenomegaly or hernias noted. Normal bowel sounds, without guarding, and without rebound.   Msk:  Symmetrical without gross deformities. Normal posture. Pulses:  Normal pulses noted. Extremities:  Without clubbing or edema. Neurologic:  Alert and  oriented x4;  grossly normal neurologically. Skin:  Intact without significant lesions or rashes. Cervical Nodes:  No significant cervical adenopathy. Psych:  Alert and cooperative. Normal mood and  affect.  Impression/Plan: Virginia Jordan is now here to undergo a screening colonoscopy.  First-ever average risk screening examination. Risks, benefits, limitations, imponderables and alternatives regarding colonoscopy have been reviewed with the patient. Questions have been answered. All parties agreeable.     Notice:  This dictation was prepared with Dragon dictation along with smaller phrase technology. Any transcriptional errors that result from this process are unintentional and may not be corrected upon review.

## 2013-11-21 NOTE — Discharge Instructions (Signed)

## 2013-11-21 NOTE — Op Note (Signed)
[facility] [address] [place], [id number]   COLONOSCOPY PROCEDURE REPORT  PATIENT: Virginia Jordan, Virginia G.  MR#:         [id number] BIRTHDATE: [birth year] , 47  yrs. old GENDER: Female ENDOSCOPIST: R.  Roetta SessionsMichael Mate Alegria, MD FACP FACG REFERRED BY:  Catalina PizzaZach Hall, M.D. PROCEDURE DATE:  11/21/2013 PROCEDURE:     Screening ileocolonoscopy  INDICATIONS: First-ever average risk colorectal cancer screening examination  INFORMED CONSENT:  The risks, benefits, alternatives and imponderables including but not limited to bleeding, perforation as well as the possibility of a missed lesion have been reviewed.  The potential for biopsy, lesion removal, etc. have also been discussed.  Questions have been answered.  All parties agreeable. Please see the history and physical in the medical record for more information.  MEDICATIONS: Versed 5 mg IV and Demerol 75 mg IV in divided doses. Zofran 4 mg IV  DESCRIPTION OF PROCEDURE:  After a digital rectal exam was performed, the EC-[id number] ([id number]  colonoscope was advanced from the anus through the rectum and colon to the area of the cecum, ileocecal valve and appendiceal orifice.  The cecum was deeply intubated.  These structures were well-seen and photographed for the record.  From the level of the cecum and ileocecal valve, the scope was slowly and cautiously withdrawn.  The mucosal surfaces were carefully surveyed utilizing scope tip deflection to facilitate fold flattening as needed.  The scope was pulled down into the rectum where a thorough examination including retroflexion was performed.    FINDINGS:  Adequate preparation. Normal rectum. Normal-appearing colonic mucosa.  The distal 10 cm of terminal ileum also appeared normal.  THERAPEUTIC / DIAGNOSTIC MANEUVERS PERFORMED:  COMPLICATIONS: none  CECAL WITHDRAWAL TIME:  12 minutes  IMPRESSION:  Normal ileocolonoscopy  RECOMMENDATIONS: Repeat screening examination in 10  years   _______________________________ eSigned:  R. Roetta SessionsMichael Aela Bohan, MD FACP [facility] 11/21/2013 10:54 AM   CC:

## 2013-11-27 ENCOUNTER — Encounter (HOSPITAL_COMMUNITY): Payer: Self-pay | Admitting: Internal Medicine

## 2013-12-12 ENCOUNTER — Encounter: Payer: Self-pay | Admitting: *Deleted

## 2013-12-12 ENCOUNTER — Telehealth: Payer: Self-pay | Admitting: Cardiology

## 2013-12-12 ENCOUNTER — Ambulatory Visit (INDEPENDENT_AMBULATORY_CARE_PROVIDER_SITE_OTHER): Payer: Commercial Managed Care - PPO | Admitting: Cardiology

## 2013-12-12 ENCOUNTER — Encounter: Payer: Self-pay | Admitting: Cardiology

## 2013-12-12 VITALS — BP 144/96 | HR 99 | Ht 65.0 in | Wt 264.0 lb

## 2013-12-12 DIAGNOSIS — R0600 Dyspnea, unspecified: Secondary | ICD-10-CM

## 2013-12-12 DIAGNOSIS — Z136 Encounter for screening for cardiovascular disorders: Secondary | ICD-10-CM

## 2013-12-12 DIAGNOSIS — R0609 Other forms of dyspnea: Secondary | ICD-10-CM

## 2013-12-12 DIAGNOSIS — R0989 Other specified symptoms and signs involving the circulatory and respiratory systems: Secondary | ICD-10-CM

## 2013-12-12 NOTE — Telephone Encounter (Signed)
GXT (exercise tolerance test)  Virginia HawkingAnnie Penn [birth year]  Register at main entrance at 1:00

## 2013-12-12 NOTE — Patient Instructions (Signed)
Your physician has requested that you have an exercise tolerance test. For further information please visit [email or web address]. Please also follow instruction sheet, as given. Office will contact with results via phone or letter.   Continue all current medications. Your physician wants you to follow up in:  1 year.  You will receive a reminder letter in the mail one-two months in advance.  If you don't receive a letter, please call our office to schedule the follow up appointment

## 2013-12-12 NOTE — Progress Notes (Signed)
Clinical Summary Ms. Embry is a 48 y.o.female seen today as a new patient.   1. Cardiovascular risk assessment - denies any chest pain - DOE x 2 flights of stairs, noted over last year. DOE walking up inclines during recent trip to [place] had to stop and catch breath. - has had some LE edema in leg at times. No orthopnea. No palpitations.  CAD risk factors: HTN, pre-diabetes. Paternal grandfather MI mid 64s. Brother MI early 62s.  Job requires stress test for work, works for [employer].     2. HTN - does not check regularly - compliant with meds, though not sure if taken today yet. Last visit with Dr Nevada Crane 136/80.   3. OSA? +snoring, + daytime somonlence.     Past Medical History  Diagnosis Date  . Hypertension   . Vertigo      Allergies  Allergen Reactions  . Aspirin Hives  . Morphine And Related Other (See Comments)    Patient states" the Dr told me not to take it because it drops my blood pressure".   . Penicillins Hives     Current Outpatient Prescriptions  Medication Sig Dispense Refill  . lisinopril (PRINIVIL,ZESTRIL) 40 MG tablet Take 40 mg by mouth daily.      . NON FORMULARY Probiotic   One daily      . peg 3350 powder (MOVIPREP) 100 G SOLR Take 1 kit (200 g total) by mouth as directed.  1 kit  1  . Phentermine-Topiramate (QSYMIA) 7.5-46 MG CP24 Take by mouth. Pt takes one tablet daily      . Vitamin D, Ergocalciferol, (DRISDOL) 50000 UNITS CAPS capsule Take 50,000 Units by mouth every 7 (seven) days. Takes 1.25 mg once weekly      . [DISCONTINUED] fluticasone (FLONASE) 50 MCG/ACT nasal spray Place 2 sprays into the nose daily.  16 g  0   No current facility-administered medications for this visit.     Past Surgical History  Procedure Laterality Date  . Gastric bypass    . Cyst excision      rt wrist, rt foot  . Colonoscopy N/A 11/21/2013    Procedure: COLONOSCOPY;  Surgeon: Daneil Dolin, MD;  Location: [facility];  Service:  Endoscopy;  Laterality: N/A;  10:30 AM     Allergies  Allergen Reactions  . Aspirin Hives  . Morphine And Related Other (See Comments)    Patient states" the Dr told me not to take it because it drops my blood pressure".   . Penicillins Hives      Family History  Problem Relation Age of Onset  . Diabetes Mother   . Heart disease Mother   . Early death Father   . Colon cancer Neg Hx   . Colon polyps Neg Hx      Social History Ms. Slotnick reports that she has never smoked. She does not have any smokeless tobacco history on file. Ms. Shorkey reports that she does not drink alcohol.   Review of Systems CONSTITUTIONAL: No weight loss, fever, chills, weakness or fatigue.  HEENT: Eyes: No visual loss, blurred vision, double vision or yellow sclerae.No hearing loss, sneezing, congestion, runny nose or sore throat.  SKIN: No rash or itching.  CARDIOVASCULAR: per HPI RESPIRATORY: No shortness of breath, cough or sputum.  GASTROINTESTINAL: No anorexia, nausea, vomiting or diarrhea. No abdominal pain or blood.  GENITOURINARY: No burning on urination, no polyuria NEUROLOGICAL: No headache, dizziness, syncope, paralysis, ataxia, numbness  or tingling in the extremities. No change in bowel or bladder control.  MUSCULOSKELETAL: No muscle, back pain, joint pain or stiffness.  LYMPHATICS: No enlarged nodes. No history of splenectomy.  PSYCHIATRIC: No history of depression or anxiety.  ENDOCRINOLOGIC: No reports of sweating, cold or heat intolerance. No polyuria or polydipsia.  Marland Kitchen   Physical Examination p 99 bp 144/96 Wt 264 lbs BMI 44 O2 sat 97%. Ambulating O2 sat 97% Gen: resting comfortably, no acute distress HEENT: no scleral icterus, pupils equal round and reactive, no palptable cervical adenopathy,  CV: RRR, no m/r/g, no JVD, no carotid bruits Resp: Clear to auscultation bilaterally GI: abdomen is soft, non-tender, non-distended, normal bowel sounds, no hepatosplenomegaly MSK:  extremities are warm, no edema.  Skin: warm, no rash Neuro:  no focal deficits Psych: appropriate affect    Assessment and Plan  1. Cardiovascular risk assessment - patient's job requires stress test -she denies any current chest pain. Has had some fairly new onset DOE, exercise stress testing will help to further evaluate, especially given her strong family history - order GXT  2. Cholesterol 11/2013 panel: TC 169 TG 107 HDL 44 LDL 104 ASCVD risk estimated score is 1.8% for 10 year risk of CVD, low risk. With 10 year risk <5% statin is not indicated.  3. HTN - managed by primary care, continue lisinopril  4. OSA? - has several risk factors and signs of possible OSA, would recommend sleep testing.   F/u 1 year   Arnoldo Lenis, M.D., F.A.C.C.

## 2013-12-13 NOTE — Telephone Encounter (Signed)
No precert required 

## 2013-12-23 ENCOUNTER — Encounter (HOSPITAL_COMMUNITY): Payer: Commercial Managed Care - PPO

## 2013-12-24 ENCOUNTER — Encounter (HOSPITAL_COMMUNITY): Payer: Self-pay

## 2013-12-24 ENCOUNTER — Encounter: Payer: Self-pay | Admitting: Cardiology

## 2013-12-24 ENCOUNTER — Inpatient Hospital Stay (HOSPITAL_COMMUNITY): Admission: RE | Admit: 2013-12-24 | Payer: Commercial Managed Care - PPO | Source: Ambulatory Visit

## 2013-12-24 ENCOUNTER — Ambulatory Visit (HOSPITAL_COMMUNITY)
Admission: RE | Admit: 2013-12-24 | Discharge: 2013-12-24 | Disposition: A | Discharge status: Home / Self Care | Payer: Commercial Managed Care - PPO | Source: Ambulatory Visit | Attending: Cardiology | Admitting: Cardiology

## 2013-12-24 DIAGNOSIS — Z0289 Encounter for other administrative examinations: Secondary | ICD-10-CM | POA: Diagnosis present

## 2013-12-24 NOTE — Addendum Note (Signed)
Encounter addended by: Sheila Oatsebecca L Maykayla Highley, RN on: 12/24/2013 11:53 AM<BR>     Documentation filed: Notes Section, Charges VN

## 2013-12-24 NOTE — Progress Notes (Signed)
Stress Lab Nurses Notes - [facility]  Naoma DienerLeona G Dissinger 12/24/2013 Reason for doing test: preventative for work, no symptoms Type of test: Regular GTX Nurse performing test: Parke PoissonPhyllis Billingsly, RN , Leitha Bleakebecca Sutton Hirsch , RN Nuclear Medicine Tech: Not Applicable Echo Tech: Not Applicable MD performing test: Ival BibleS. McDowell Family MD: Catalina PizzaZach Hall Test explained and consent signed: Yes.   IV started: No IV started Symptoms: tired, sob Treatment/Intervention: None Reason test stopped: SOB After recovery IV was: No IV Patient to return to Nuc. Med at : not applicable  Patient discharged: Home Patient's Condition upon discharge was: stable Comments: During procedure Peak BP 178/82  HR160  Recovery BP 135/79 HR 108, Symptoms resolved Rasaan Brotherton, Lurena Joinerebecca

## 2013-12-24 NOTE — Progress Notes (Signed)
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 Quintavious Rinck  Family MD: Catalina PizzaZach Hall  Test explained and consent signed: Yes.  IV started: No IV started  Symptoms: Tired, dyspnea. No chest pain.  Treatment/Intervention: None  Reason test stopped: SOB  After recovery IV was: No IV  Patient discharged: Home  Patient's Condition upon discharge was: Stable  Comments: During procedure Peak BP 178/82 HR160 Recovery BP 135/79 HR 108, Symptoms resolved  [name], [name]  Attending note:  Patient exercised on Bruce protocol for 6:11 reaching maximum workload 7.5 METS. Peak heart rate 162 BPM which was 95% MPHR. Peak blood pressure 152/73. No chest pain reported. No diagnostic ST segment changes to indicate ischemia. No arrhythmias. Low risk Duke treadmill score of 6.  Jonelle SidleSamuel G. Veer Elamin, M.D., F.A.C.C.

## 2013-12-25 NOTE — Progress Notes (Signed)
Negative stress test. Patient needed it done for her job,please send wherever it may need to go   Virginia FerryJ Branch MD

## 2013-12-26 ENCOUNTER — Telehealth: Payer: Self-pay | Admitting: *Deleted

## 2013-12-26 NOTE — Telephone Encounter (Signed)
Left message to give info needed to fax results of stress test (as requested by pt)

## 2013-12-30 ENCOUNTER — Other Ambulatory Visit: Payer: Self-pay | Admitting: *Deleted

## 2013-12-30 DIAGNOSIS — R400 Somnolence: Secondary | ICD-10-CM

## 2013-12-30 NOTE — Telephone Encounter (Signed)
Please put referral for her to see sleep medicine in [place], diagnosis is somnolence.   Dominga Ferry MD

## 2013-12-30 NOTE — Telephone Encounter (Signed)
Pt notified of stress test results. Results were faxed to Dr. Margo Aye. Pt stated that she was to be referred to have a sleep test for sleep apnea.

## 2013-12-30 NOTE — Telephone Encounter (Signed)
Patient is asking if she can have it done at [facility] instead since it is closer.

## 2013-12-30 NOTE — Telephone Encounter (Signed)
 is fine, please refer her to Dr Juanetta Gosling to arrange.  Dominga Ferry MD

## 2013-12-30 NOTE — Telephone Encounter (Signed)
Pt notified that she will be referred to Dr. Juanetta Gosling for her sleep study per her preference.

## 2014-01-02 ENCOUNTER — Telehealth: Payer: Self-pay | Admitting: Cardiology

## 2014-01-02 DIAGNOSIS — R400 Somnolence: Secondary | ICD-10-CM

## 2014-01-02 NOTE — Telephone Encounter (Signed)
Noted, thanks!

## 2014-01-02 NOTE — Telephone Encounter (Signed)
12/30/2013 OFFICE NOTES FAXED TO DR. HAWKINS FOR EVALUATION   01/01/14 Note from Dr. Juanetta Gosling office stating that patient wants an appointment elsewhere because Dr. Juanetta Gosling is booked until February 27, 2014.   01/01/14 called and left patient a message to return call so that we can refer to another ENT.

## 2014-01-03 NOTE — Telephone Encounter (Signed)
Patient returned call today - requesting that we schedule her some place else in the [facility].  Informed patient that we could try [facility].  Will put order in & notify Vicky.

## 2014-02-14 ENCOUNTER — Encounter (INDEPENDENT_AMBULATORY_CARE_PROVIDER_SITE_OTHER): Payer: Self-pay

## 2014-02-14 ENCOUNTER — Ambulatory Visit (INDEPENDENT_AMBULATORY_CARE_PROVIDER_SITE_OTHER): Payer: Commercial Managed Care - PPO | Admitting: Pulmonary Disease

## 2014-02-14 ENCOUNTER — Encounter: Payer: Self-pay | Admitting: Pulmonary Disease

## 2014-02-14 VITALS — BP 138/96 | HR 77 | Temp 98.3°F | Ht 65.0 in | Wt 271.4 lb

## 2014-02-14 DIAGNOSIS — R0683 Snoring: Secondary | ICD-10-CM

## 2014-02-14 NOTE — Progress Notes (Signed)
Chief Complaint  Patient presents with  . SLEEP CONSULT    Referred by Dr Wyline MoodBranch. Epworth Score: 13    History of Present Illness: Virginia Jordan is a 48 y.o. female for evaluation of sleep problems.  She was seen recently by cardiology.  There was concern about her snoring.  She has family hx of CAD and CVA.  She was advised to have further sleep evaluation.  She has noticed more trouble with daytime sleepiness unless she keeps herself active.  She is very busy at work, and sometimes feels overwhelmed.  She can have trouble falling asleep and staying asleep.  She will often lay awake in bed thinking about her work schedule.  She has been feeling depressed, and is due to see a specialist to further assess this.  She has also noticed feeling more sleepy during the day.  She will fall asleep while watching TV or working on computer.  She is divorced.  When she was married her husband told her that she would snore, and stop breathing while asleep.  She will wake up feeling like she can't breath.  She will talk in her sleep, and sometimes gets cramps in her feet.  She goes to sleep between 11 pm and 1 am.  She falls asleep after 30 minutes to 1 hour, and will watch TV in bed.  She wakes up several times during the night, and sometimes will not fall back to sleep for a couple of hours.  She gets out of bed at different times in the morning, depending on when she needs to go to work.  She feels tired in the morning.  She denies morning headache.  She does not use anything to help her fall sleep or stay awake.  She denies sleep walking, bruxism, or nightmares.  There is no history of restless legs.  She denies sleep hallucinations, sleep paralysis, or cataplexy.  The Epworth score is 13 out of 24.  Virginia DienerLeona G Summerfield  has a past medical history of Hypertension and Vertigo.  Virginia DienerLeona G Speas  has past surgical history that includes Gastric bypass; Cyst excision; and Colonoscopy (N/A, 11/21/2013).  Prior  to Admission medications   Medication Sig Start Date End Date Taking? Authorizing Provider  lisinopril (PRINIVIL,ZESTRIL) 40 MG tablet Take 40 mg by mouth daily.   Yes Historical Provider, MD  Phentermine-Topiramate (QSYMIA) 7.5-46 MG CP24 Take by mouth. Pt takes one tablet daily   Yes Historical Provider, MD  Vitamin D, Ergocalciferol, (DRISDOL) 50000 UNITS CAPS capsule Take 50,000 Units by mouth every 7 (seven) days. Takes 1.25 mg once weekly   Yes Historical Provider, MD    Allergies  Allergen Reactions  . Aspirin Hives  . Morphine And Related Other (See Comments)    Patient states" the Dr told me not to take it because it drops my blood pressure".   . Penicillins Hives    Her family history includes Diabetes in her mother; Early death in her father; Heart disease in her mother. There is no history of Colon cancer or Colon polyps.  She  reports that she has never smoked. She does not have any smokeless tobacco history on file. She reports that she does not drink alcohol or use illicit drugs.  Review of Systems  Constitutional: Negative for fever and unexpected weight change.  HENT: Negative for congestion, dental problem, ear pain, nosebleeds, postnasal drip, rhinorrhea, sinus pressure, sneezing, sore throat and trouble swallowing.   Eyes: Negative for redness and itching.  Respiratory: Negative for cough, chest tightness, shortness of breath and wheezing.   Cardiovascular: Positive for leg swelling (left foot). Negative for palpitations.  Gastrointestinal: Negative for nausea and vomiting.  Genitourinary: Negative for dysuria.  Musculoskeletal: Negative for joint swelling.  Skin: Negative for rash.  Neurological: Positive for headaches ( migraines).  Hematological: Does not bruise/bleed easily.  Psychiatric/Behavioral: Positive for dysphoric mood. The patient is nervous/anxious.    Physical Exam:  General - No distress ENT - No sinus tenderness, no oral exudate, no LAN, no  thyromegaly, TM clear, pupils equal/reactive, MP 3, scalloped tongue Cardiac - s1s2 regular, no murmur, pulses symmetric Chest - No wheeze/rales/dullness, good air entry, normal respiratory excursion Back - No focal tenderness Abd - Soft, non-tender, no organomegaly, + bowel sounds Ext - No edema Neuro - Normal strength, cranial nerves intact Skin - No rashes Psych - Normal mood, and behavior  Assessment/plan:  Coralyn HellingVineet Shane Melby, M.D. Pager [phone]

## 2014-02-14 NOTE — Progress Notes (Deleted)
   Subjective:    Patient ID: Virginia Jordan, female    DOB: 01-10-1966, 48 y.o.   MRN: [id number]  HPI    Review of Systems  Constitutional: Negative for fever and unexpected weight change.  HENT: Negative for congestion, dental problem, ear pain, nosebleeds, postnasal drip, rhinorrhea, sinus pressure, sneezing, sore throat and trouble swallowing.   Eyes: Negative for redness and itching.  Respiratory: Negative for cough, chest tightness, shortness of breath and wheezing.   Cardiovascular: Positive for leg swelling (left foot). Negative for palpitations.  Gastrointestinal: Negative for nausea and vomiting.  Genitourinary: Negative for dysuria.  Musculoskeletal: Negative for joint swelling.  Skin: Negative for rash.  Neurological: Positive for headaches ( migraines).  Hematological: Does not bruise/bleed easily.  Psychiatric/Behavioral: Positive for dysphoric mood. The patient is nervous/anxious.        Objective:   Physical Exam        Assessment & Plan:

## 2014-02-14 NOTE — Assessment & Plan Note (Signed)
She has snoring, sleep disruption, daytime sleepiness, and witnessed apnea.  Her BMI is > 35.  She has hx of hypertension.  I am concerned she could have sleep apnea.  We discussed how sleep apnea can affect various health problems including risks for hypertension, cardiovascular disease, and diabetes.  We also discussed how sleep disruption can increase risks for accident, such as while driving.  Weight loss as a means of improving sleep apnea was also reviewed.  Additional treatment options discussed were CPAP therapy, oral appliance, and surgical intervention.  To further assess will arrange for home sleep study pending insurance approval.

## 2014-02-14 NOTE — Patient Instructions (Signed)
Will schedule home sleep study Follow up after home sleep study reviewed

## 2014-04-17 DIAGNOSIS — G4730 Sleep apnea, unspecified: Secondary | ICD-10-CM

## 2014-04-24 DIAGNOSIS — G4730 Sleep apnea, unspecified: Secondary | ICD-10-CM

## 2014-04-28 ENCOUNTER — Encounter: Payer: Self-pay | Admitting: Pulmonary Disease

## 2014-05-27 ENCOUNTER — Telehealth: Payer: Self-pay | Admitting: Pulmonary Disease

## 2014-05-27 DIAGNOSIS — R0683 Snoring: Secondary | ICD-10-CM

## 2014-05-27 NOTE — Telephone Encounter (Signed)
HST 04/17/14 >> AHI 16.7, SaO2 low 78%  Will have my nurse inform pt that sleep study shows mild sleep apnea.  Options are 1) set up CPAP now with ROV in 2 months, or 2) have ROV first.  If pt is agreeable to CPAP set up, then please arrange for auto CPAP with pressure range 5 to 15 cm H2O, heated humidity, and mask of choice.  Have download sent 1 month after starting CPAP, and ROV 2 months after starting CPAP.

## 2014-05-28 NOTE — Telephone Encounter (Signed)
LMTCB x 1 

## 2014-05-28 NOTE — Telephone Encounter (Signed)
Pt is calling back wanting results [phone]

## 2014-05-28 NOTE — Telephone Encounter (Signed)
Results have been explained to patient, pt expressed understanding. Order placed for CPAP and download. 2 months recall reminder placed. Nothing further needed.

## 2014-05-28 NOTE — Telephone Encounter (Signed)
Pt is calling back wanting results [phone] °

## 2014-08-01 ENCOUNTER — Emergency Department (HOSPITAL_COMMUNITY)
Admission: EM | Admit: 2014-08-01 | Discharge: 2014-08-01 | Disposition: A | Discharge status: Home / Self Care | Payer: Commercial Managed Care - PPO | Attending: Emergency Medicine | Admitting: Emergency Medicine

## 2014-08-01 ENCOUNTER — Encounter (HOSPITAL_COMMUNITY): Payer: Self-pay | Admitting: *Deleted

## 2014-08-01 DIAGNOSIS — N764 Abscess of vulva: Secondary | ICD-10-CM | POA: Diagnosis present

## 2014-08-01 DIAGNOSIS — Z79899 Other long term (current) drug therapy: Secondary | ICD-10-CM | POA: Insufficient documentation

## 2014-08-01 DIAGNOSIS — Z880 Allergy status to penicillin: Secondary | ICD-10-CM | POA: Diagnosis not present

## 2014-08-01 DIAGNOSIS — R61 Generalized hyperhidrosis: Secondary | ICD-10-CM | POA: Insufficient documentation

## 2014-08-01 DIAGNOSIS — E119 Type 2 diabetes mellitus without complications: Secondary | ICD-10-CM | POA: Insufficient documentation

## 2014-08-01 DIAGNOSIS — I10 Essential (primary) hypertension: Secondary | ICD-10-CM | POA: Insufficient documentation

## 2014-08-01 HISTORY — DX: Type 2 diabetes mellitus without complications: E11.9

## 2014-08-01 LAB — CBG MONITORING, ED: Glucose-Capillary: 112 mg/dL — ABNORMAL HIGH (ref 70–99)

## 2014-08-01 MED ORDER — DOXYCYCLINE HYCLATE 50 MG PO CAPS: 100.0000 mg | ORAL_CAPSULE | Freq: Two times a day (BID) | ORAL | Status: DC

## 2014-08-01 MED ORDER — LIDOCAINE HCL (PF) 1 % IJ SOLN
5.0000 mL | Freq: Once | INTRAMUSCULAR | Status: AC
  Administered 2014-08-01: 5 mL via INTRADERMAL
  Filled 2014-08-01: qty 5

## 2014-08-01 MED ORDER — OXYCODONE-ACETAMINOPHEN 5-325 MG PO TABS: 1.0000 | ORAL_TABLET | ORAL | Status: DC | PRN

## 2014-08-01 MED ORDER — OXYCODONE-ACETAMINOPHEN 5-325 MG PO TABS
ORAL_TABLET | ORAL | Status: AC
  Filled 2014-08-01: qty 1

## 2014-08-01 MED ORDER — OXYCODONE-ACETAMINOPHEN 5-325 MG PO TABS
1.0000 | ORAL_TABLET | Freq: Once | ORAL | Status: AC
  Administered 2014-08-01: 1 via ORAL

## 2014-08-01 NOTE — ED Notes (Signed)
Pt alert & oriented x4, stable gait. Patient given discharge instructions, paperwork & prescription(s). Patient  instructed to stop at the registration desk to finish any additional paperwork. Patient verbalized understanding. Pt left department w/ no further questions. 

## 2014-08-01 NOTE — ED Notes (Signed)
Abscess to groin, on lt.

## 2014-08-01 NOTE — ED Provider Notes (Signed)
CSN: [id number]     Arrival date & time 08/01/14  1524 History   First MD Initiated Contact with Patient 08/01/14 1629     Chief Complaint  Patient presents with  . Abscess     (Consider location/radiation/quality/duration/timing/severity/associated sxs/prior Treatment) Patient is a 49 y.o. female presenting with abscess. The history is provided by the patient. No language interpreter was used.  Abscess Location:  Ano-genital Ano-genital abscess location:  Groin Abscess quality: fluctuance, painful, redness and warmth   Abscess quality: not draining   Red streaking: no   Duration:  4 weeks Associated symptoms: no fever   Associated symptoms comment:  Abscess left labia, growing over 4 weeks without drainage. She found that it was turning red and pain was increased prompting ED visit. No fever, but she reports night sweats.    Past Medical History  Diagnosis Date  . Hypertension   . Vertigo   . Diabetes mellitus without complication    Past Surgical History  Procedure Laterality Date  . Gastric bypass    . Cyst excision      rt wrist, rt foot  . Colonoscopy N/A 11/21/2013    Procedure: COLONOSCOPY;  Surgeon: Corbin Ade, MD;  Location: [facility];  Service: Endoscopy;  Laterality: N/A;  10:30 AM   Family History  Problem Relation Age of Onset  . Diabetes Mother   . Heart disease Mother   . Early death Father   . Colon cancer Neg Hx   . Colon polyps Neg Hx    History  Substance Use Topics  . Smoking status: Never Smoker   . Smokeless tobacco: Not on file  . Alcohol Use: No   OB History    No data available     Review of Systems  Constitutional: Positive for diaphoresis. Negative for fever.  Gastrointestinal: Negative for abdominal pain.  Musculoskeletal: Negative for myalgias.  Skin:       See HPI.      Allergies  Aspirin; Morphine and related; and Penicillins  Home Medications   Prior to Admission medications   Medication Sig Start Date End  Date Taking? Authorizing Provider  lisinopril (PRINIVIL,ZESTRIL) 40 MG tablet Take 40 mg by mouth daily.    Historical Provider, MD  Phentermine-Topiramate (QSYMIA) 7.5-46 MG CP24 Take by mouth. Pt takes one tablet daily    Historical Provider, MD  Vitamin D, Ergocalciferol, (DRISDOL) 50000 UNITS CAPS capsule Take 50,000 Units by mouth every 7 (seven) days. Takes 1.25 mg once weekly    Historical Provider, MD   BP 151/86 mmHg  Pulse 106  Temp(Src) 98.3 F (36.8 C) (Oral)  Resp 18  Ht  (1.651 m)  Wt 258 lb (117.028 kg)  BMI 42.93 kg/m2  SpO2 99%  LMP 07/28/2014 Physical Exam  Constitutional: She is oriented to person, place, and time. She appears well-developed and well-nourished. No distress.  Neurological: She is alert and oriented to person, place, and time.  Skin:  3 cm diameter area of induration mid external labia on left. Central area fluctuance without wound or drainage.     ED Course  Procedures (including critical care time) Labs Review Labs Reviewed  CBG MONITORING, ED - Abnormal; Notable for the following:    Glucose-Capillary 112 (*)    All other components within normal limits    Imaging Review No results found.   EKG Interpretation None     INCISION AND DRAINAGE Performed by: Elpidio Anis A Consent: Verbal consent obtained. Risks and  benefits: risks, benefits and alternatives were discussed Type: abscess  Body area: left labia  Anesthesia: local infiltration  Incision was made with a scalpel.  Local anesthetic: lidocaine 2% w/o epinephrine  Anesthetic total: 3 ml  Complexity: complex Blunt dissection to break up loculations  Drainage: purulent  Drainage amount: large  Packing material: 1/4 in iodoform gauze  Patient tolerance: Patient tolerated the procedure well with no immediate complications.    MDM   Final diagnoses:  None    1. Labial abscess   Uncomplicated abscess I&D'd per above note. Will treat with abx given  diabetic status and night sweats. 2-day recheck for packing removal.     Elpidio AnisShari Athira Janowicz, PA-C 08/01/14 1741  Raeford RazorStephen Kohut, MD 08/02/14 [phone]

## 2014-08-01 NOTE — Discharge Instructions (Signed)
Abscess °Care After °An abscess (also called a boil or furuncle) is an infected area that contains a collection of pus. Signs and symptoms of an abscess include pain, tenderness, redness, or hardness, or you may feel a moveable soft area under your skin. An abscess can occur anywhere in the body. The infection may spread to surrounding tissues causing cellulitis. A cut (incision) by the surgeon was made over your abscess and the pus was drained out. Gauze may have been packed into the space to provide a drain that will allow the cavity to heal from the inside outwards. The boil may be painful for 5 to 7 days. Most people with a boil do not have high fevers. Your abscess, if seen early, may not have localized, and may not have been lanced. If not, another appointment may be required for this if it does not get better on its own or with medications. °HOME CARE INSTRUCTIONS  °· Only take over-the-counter or prescription medicines for pain, discomfort, or fever as directed by your caregiver. °· When you bathe, soak and then remove gauze or iodoform packs at least daily or as directed by your caregiver. You may then wash the wound gently with mild soapy water. Repack with gauze or do as your caregiver directs. °SEEK IMMEDIATE MEDICAL CARE IF:  °· You develop increased pain, swelling, redness, drainage, or bleeding in the wound site. °· You develop signs of generalized infection including muscle aches, chills, fever, or a general ill feeling. °· An oral temperature above 102° F (38.9° C) develops, not controlled by medication. °See your caregiver for a recheck if you develop any of the symptoms described above. If medications (antibiotics) were prescribed, take them as directed. °Document Released: 11/11/2004 Document Revised: 07/18/2011 Document Reviewed: 07/09/2007 °ExitCare® Patient Information ©2015 [employer]. This information is not intended to replace advice given to you by your health care provider. Make sure  you discuss any questions you have with your health care provider. ° °Incision and Drainage °Incision and drainage is a procedure in which a sac-like structure (cystic structure) is opened and drained. The area to be drained usually contains material such as pus, fluid, or blood.  °LET YOUR CAREGIVER KNOW ABOUT:  °· Allergies to medicine. °· Medicines taken, including vitamins, herbs, eyedrops, over-the-counter medicines, and creams. °· Use of steroids (by mouth or creams). °· Previous problems with anesthetics or numbing medicines. °· History of bleeding problems or blood clots. °· Previous surgery. °· Other health problems, including diabetes and kidney problems. °· Possibility of pregnancy, if this applies. °RISKS AND COMPLICATIONS °· Pain. °· Bleeding. °· Scarring. °· Infection. °BEFORE THE PROCEDURE  °You may need to have an ultrasound or other imaging tests to see how large or deep your cystic structure is. Blood tests may also be used to determine if you have an infection or how severe the infection is. You may need to have a tetanus shot. °PROCEDURE  °The affected area is cleaned with a cleaning fluid. The cyst area will then be numbed with a medicine (local anesthetic). A small incision will be made in the cystic structure. A syringe or catheter may be used to drain the contents of the cystic structure, or the contents may be squeezed out. The area will then be flushed with a cleansing solution. After cleansing the area, it is often gently packed with a gauze or another wound dressing. Once it is packed, it will be covered with gauze and tape or some other type of   wound dressing.  °AFTER THE PROCEDURE  °· Often, you will be allowed to go home right after the procedure. °· You may be given antibiotic medicine to prevent or heal an infection. °· If the area was packed with gauze or some other wound dressing, you will likely need to come back in 1 to 2 days to get it removed. °· The area should heal in about  14 days. °Document Released: 10/19/2000 Document Revised: 10/25/2011 Document Reviewed: 06/20/2011 °ExitCare® Patient Information ©2015 [employer]. This information is not intended to replace advice given to you by your health care provider. Make sure you discuss any questions you have with your health care provider. ° °

## 2014-08-03 ENCOUNTER — Emergency Department (HOSPITAL_COMMUNITY)
Admission: EM | Admit: 2014-08-03 | Discharge: 2014-08-03 | Disposition: A | Discharge status: Home / Self Care | Payer: Commercial Managed Care - PPO | Attending: Emergency Medicine | Admitting: Emergency Medicine

## 2014-08-03 ENCOUNTER — Encounter (HOSPITAL_COMMUNITY): Payer: Self-pay | Admitting: Emergency Medicine

## 2014-08-03 DIAGNOSIS — Z79899 Other long term (current) drug therapy: Secondary | ICD-10-CM | POA: Diagnosis not present

## 2014-08-03 DIAGNOSIS — Z4801 Encounter for change or removal of surgical wound dressing: Secondary | ICD-10-CM | POA: Diagnosis not present

## 2014-08-03 DIAGNOSIS — E119 Type 2 diabetes mellitus without complications: Secondary | ICD-10-CM | POA: Insufficient documentation

## 2014-08-03 DIAGNOSIS — Z880 Allergy status to penicillin: Secondary | ICD-10-CM | POA: Diagnosis not present

## 2014-08-03 DIAGNOSIS — I10 Essential (primary) hypertension: Secondary | ICD-10-CM | POA: Insufficient documentation

## 2014-08-03 DIAGNOSIS — Z5189 Encounter for other specified aftercare: Secondary | ICD-10-CM

## 2014-08-03 NOTE — ED Provider Notes (Signed)
CSN: [id number]     Arrival date & time 08/03/14  1255 History  This chart was scribed for Virginia Quale, PA-C, working with Vanetta Mulders, MD by Kirah Carry, ED Scribe. The patient was seen in [facility]. The patient's care was started at 2:14 PM.     Chief Complaint  Patient presents with  . Wound Check   Patient is a 49 y.o. female presenting with wound check. The history is provided by the patient. No language interpreter was used.  Wound Check This is a new problem. Episode onset: 4 weeks. The problem occurs constantly. The problem has not changed since onset.  HPI Comments: Virginia Jordan is a 49 y.o. female who presents to the Emergency Department for a wound check. Patient reports that she visited ED two days ago for an abscess on her left groin that she first noticed four weeks ago. She reports that an incision and drainage procedure was performed when she came to the ED on Friday. Patient states that she was instructed to return today to have the incision checked. She reports that it feel much better. Patient denies drainage from the abscess.   PCP is Dr.Hall.  Past Medical History  Diagnosis Date  . Hypertension   . Vertigo   . Diabetes mellitus without complication    Past Surgical History  Procedure Laterality Date  . Gastric bypass    . Cyst excision      rt wrist, rt foot  . Colonoscopy N/A 11/21/2013    Procedure: COLONOSCOPY;  Surgeon: Corbin Ade, MD;  [facility];  Service: Endoscopy;  Laterality: N/A;  10:30 AM   Family History  Problem Relation Age of Onset  . Diabetes Mother   . Heart disease Mother   . Early death Father   . Colon cancer Neg Hx   . Colon polyps Neg Hx    History  Substance Use Topics  . Smoking status: Never Smoker   . Smokeless tobacco: Not on file  . Alcohol Use: No   OB History    No data available     Review of Systems  Skin: Positive for wound (Abscess).  All other systems reviewed and are  negative.     Allergies  Aspirin; Morphine and related; and Penicillins  Home Medications   Prior to Admission medications   Medication Sig Start Date End Date Taking? Authorizing Provider  doxycycline (VIBRAMYCIN) 50 MG capsule Take 2 capsules (100 mg total) by mouth 2 (two) times daily. 08/01/14  Yes Shari Upstill, PA-C  lisinopril (PRINIVIL,ZESTRIL) 40 MG tablet Take 40 mg by mouth daily.   Yes Historical Provider, MD  metFORMIN (GLUCOPHAGE) 500 MG tablet Take 500 mg by mouth daily with breakfast.   Yes Historical Provider, MD  oxyCODONE-acetaminophen (PERCOCET/ROXICET) 5-325 MG per tablet Take 1-2 tablets by mouth every 4 (four) hours as needed for severe pain. 08/01/14  Yes Shari Upstill, PA-C  Phentermine-Topiramate (QSYMIA) 7.5-46 MG CP24 Take by mouth. Pt takes one tablet daily   Yes Historical Provider, MD  Vitamin D, Ergocalciferol, (DRISDOL) 50000 UNITS CAPS capsule Take 50,000 Units by mouth every 7 (seven) days. Takes 1.25 mg once weekly   Yes Historical Provider, MD   Triage Vitals: BP 153/101 mmHg  Pulse 99  Temp(Src) 98.7 F (37.1 C) (Oral)  Resp 18  Ht  (1.651 m)  Wt 258 lb (117.028 kg)  BMI 42.93 kg/m2  SpO2 100%  LMP 07/28/2014 Physical Exam  Constitutional: She is  oriented to person, place, and time. She appears well-developed and well-nourished. No distress.  HENT:  Head: Normocephalic and atraumatic.  Eyes: Conjunctivae and EOM are normal.  Neck: Neck supple. No tracheal deviation present.  Cardiovascular: Normal rate.   Pulmonary/Chest: Effort normal. No respiratory distress.  Genitourinary:  Chaperone present during examination. Abscess of the left groin area progressing nicely. Minimal drainage. No red streaking. Minimal tenderness. No involvement of the greater or lesser labia. No palpable inguinal nodes on the left.  Musculoskeletal: Normal range of motion.  Neurological: She is alert and oriented to person, place, and time.  Skin: Skin is warm  and dry.  Psychiatric: She has a normal mood and affect. Her behavior is normal.  Nursing note and vitals reviewed.   ED Course  Procedures (including critical care time) DIAGNOSTIC STUDIES: Oxygen Saturation is 100% on room air, normal by my interpretation.    COORDINATION OF CARE:    Labs Review Labs Reviewed - No data to display  Imaging Review No results found.   EKG Interpretation None      MDM  Incision and drainage area to the left groin is progressing nicely. No red streaks appreciated. No temperature elevation. The patient is advised to use warm tub soaks, to change dressing daily, and to return to the emergency department or see Dr. Margo AyeHall for reevaluation if any changes, problems, or concerns.    Final diagnoses:  None    **I have reviewed nursing notes, vital signs, and all appropriate lab and imaging results for this patient.*  **I personally performed the services described in this documentation, which was scribed in my presence. The recorded information has been reviewed and is accurate.Virginia Jordan*   Nickolas Chalfin, PA-C 08/03/14 1426  Vanetta MuldersScott Zackowski, MD 08/04/14 [phone]

## 2014-08-03 NOTE — ED Notes (Signed)
Patient states needs to have packing removed from groin.

## 2014-08-03 NOTE — Discharge Instructions (Signed)
Please return to the emergency room, or see Dr. Loleta ChanceHill if any changes, problems, or concerns. Wound Care Wound care helps prevent pain and infection.  You may need a tetanus shot if:  You cannot remember when you had your last tetanus shot.  You have never had a tetanus shot.  The injury broke your skin. If you need a tetanus shot and you choose not to have one, you may get tetanus. Sickness from tetanus can be serious. HOME CARE   Only take medicine as told by your doctor.  Clean the wound daily with mild soap and water.  Change any bandages (dressings) as told by your doctor.  Put medicated cream and a bandage on the wound as told by your doctor.  Change the bandage if it gets wet, dirty, or starts to smell.  Take showers. Do not take baths, swim, or do anything that puts your wound under water.  Rest and raise (elevate) the wound until the pain and puffiness (swelling) are better.  Keep all doctor visits as told. GET HELP RIGHT AWAY IF:   Yellowish-white fluid (pus) comes from the wound.  Medicine does not lessen your pain.  There is a red streak going away from the wound.  You have a fever. MAKE SURE YOU:   Understand these instructions.  Will watch your condition.  Will get help right away if you are not doing well or get worse. Document Released: 02/02/2008 Document Revised: 07/18/2011 Document Reviewed: 08/29/2010 [facility] Patient Information 2015 [place], [place]. This information is not intended to replace advice given to you by your health care provider. Make sure you discuss any questions you have with your health care provider.

## 2014-08-03 NOTE — ED Notes (Signed)
No packing noted on assessment of left groin abscess.  No drainage.  Incision clean and dry.  Pt states that it feels much better.

## 2014-09-27 ENCOUNTER — Encounter (HOSPITAL_COMMUNITY): Payer: Self-pay | Admitting: *Deleted

## 2014-09-27 ENCOUNTER — Emergency Department (INDEPENDENT_AMBULATORY_CARE_PROVIDER_SITE_OTHER)
Admission: EM | Admit: 2014-09-27 | Discharge: 2014-09-27 | Disposition: A | Discharge status: Home / Self Care | Payer: Commercial Managed Care - PPO | Source: Home / Self Care | Attending: Family Medicine | Admitting: Family Medicine

## 2014-09-27 DIAGNOSIS — J40 Bronchitis, not specified as acute or chronic: Secondary | ICD-10-CM | POA: Diagnosis not present

## 2014-09-27 MED ORDER — ALBUTEROL SULFATE HFA 108 (90 BASE) MCG/ACT IN AERS
2.0000 | INHALATION_SPRAY | Freq: Four times a day (QID) | RESPIRATORY_TRACT | Status: AC | PRN
Start: 2014-09-27 — End: ?

## 2014-09-27 MED ORDER — IPRATROPIUM BROMIDE 0.06 % NA SOLN: 2.0000 | Freq: Four times a day (QID) | NASAL | Status: DC

## 2014-09-27 MED ORDER — IPRATROPIUM-ALBUTEROL 0.5-2.5 (3) MG/3ML IN SOLN
RESPIRATORY_TRACT | Status: AC
  Filled 2014-09-27: qty 3

## 2014-09-27 MED ORDER — PREDNISONE 10 MG PO TABS: 30.0000 mg | ORAL_TABLET | Freq: Every day | ORAL | Status: DC

## 2014-09-27 MED ORDER — GUAIFENESIN-CODEINE 100-10 MG/5ML PO SOLN: 5.0000 mL | Freq: Every evening | ORAL | Status: DC | PRN

## 2014-09-27 MED ORDER — IPRATROPIUM-ALBUTEROL 0.5-2.5 (3) MG/3ML IN SOLN
3.0000 mL | Freq: Once | RESPIRATORY_TRACT | Status: AC
  Administered 2014-09-27: 3 mL via RESPIRATORY_TRACT

## 2014-09-27 NOTE — ED Notes (Signed)
Pt reports   Symptoms  Of  Cough /  Congested     With  Back pain  And  Cough   Worse          At  Night

## 2014-09-27 NOTE — Discharge Instructions (Signed)
Thank you for coming in today. °Call or go to the emergency room if you get worse, have trouble breathing, have chest pains, or palpitations.  ° °Acute Bronchitis °Bronchitis is inflammation of the airways that extend from the windpipe into the lungs (bronchi). The inflammation often causes mucus to develop. This leads to a cough, which is the most common symptom of bronchitis.  °In acute bronchitis, the condition usually develops suddenly and goes away over time, usually in a couple weeks. Smoking, allergies, and asthma can make bronchitis worse. Repeated episodes of bronchitis may cause further lung problems.  °CAUSES °Acute bronchitis is most often caused by the same virus that causes a cold. The virus can spread from person to person (contagious) through coughing, sneezing, and touching contaminated objects. °SIGNS AND SYMPTOMS  °· Cough.   °· Fever.   °· Coughing up mucus.   °· Body aches.   °· Chest congestion.   °· Chills.   °· Shortness of breath.   °· Sore throat.   °DIAGNOSIS  °Acute bronchitis is usually diagnosed through a physical exam. Your health care provider will also ask you questions about your medical history. Tests, such as chest X-rays, are sometimes done to rule out other conditions.  °TREATMENT  °Acute bronchitis usually goes away in a couple weeks. Oftentimes, no medical treatment is necessary. Medicines are sometimes given for relief of fever or cough. Antibiotic medicines are usually not needed but may be prescribed in certain situations. In some cases, an inhaler may be recommended to help reduce shortness of breath and control the cough. A cool mist vaporizer may also be used to help thin bronchial secretions and make it easier to clear the chest.  °HOME CARE INSTRUCTIONS °· Get plenty of rest.   °· Drink enough fluids to keep your urine clear or pale yellow (unless you have a medical condition that requires fluid restriction). Increasing fluids may help thin your respiratory secretions  (sputum) and reduce chest congestion, and it will prevent dehydration.   °· Take medicines only as directed by your health care provider. °· If you were prescribed an antibiotic medicine, finish it all even if you start to feel better. °· Avoid smoking and secondhand smoke. Exposure to cigarette smoke or irritating chemicals will make bronchitis worse. If you are a smoker, consider using nicotine gum or skin patches to help control withdrawal symptoms. Quitting smoking will help your lungs heal faster.   °· Reduce the chances of another bout of acute bronchitis by washing your hands frequently, avoiding people with cold symptoms, and trying not to touch your hands to your mouth, nose, or eyes.   °· Keep all follow-up visits as directed by your health care provider.   °SEEK MEDICAL CARE IF: °Your symptoms do not improve after 1 week of treatment.  °SEEK IMMEDIATE MEDICAL CARE IF: °· You develop an increased fever or chills.   °· You have chest pain.   °· You have severe shortness of breath. °· You have bloody sputum.   °· You develop dehydration. °· You faint or repeatedly feel like you are going to pass out. °· You develop repeated vomiting. °· You develop a severe headache. °MAKE SURE YOU:  °· Understand these instructions. °· Will watch your condition. °· Will get help right away if you are not doing well or get worse. °Document Released: 06/02/2004 Document Revised: 09/09/2013 Document Reviewed: 10/16/2012 °ExitCare® Patient Information ©2015 [employer]. This information is not intended to replace advice given to you by your health care provider. Make sure you discuss any questions you have with your   health care provider. ° °

## 2014-09-27 NOTE — ED Provider Notes (Signed)
Virginia DienerLeona G Jordan is a 49 y.o. female who presents to Urgent Care today for cough congestion and runny nose and sore throat and sinus pain and pressure. Cough is productive. She notes mild wheezing and shortness of breath as well. No chest pains or palpitations nausea vomiting diarrhea fevers or chills. She's tried some over-the-counter medications which helped a bit. The cough is interfering with sleep.   Past Medical History  Diagnosis Date  . Hypertension   . Vertigo   . Diabetes mellitus without complication    Past Surgical History  Procedure Laterality Date  . Gastric bypass    . Cyst excision      rt wrist, rt foot  . Colonoscopy N/A 11/21/2013    Procedure: COLONOSCOPY;  Surgeon: Corbin Adeobert M Rourk, MD;  Location[facility];  Service: Endoscopy;  Laterality: N/A;  10:30 AM   History  Substance Use Topics  . Smoking status: Never Smoker   . Smokeless tobacco: Not on file  . Alcohol Use: No   ROS as above Medications: No current facility-administered medications for this encounter.   Current Outpatient Prescriptions  Medication Sig Dispense Refill  . albuterol (PROVENTIL HFA;VENTOLIN HFA) 108 (90 BASE) MCG/ACT inhaler Inhale 2 puffs into the lungs every 6 (six) hours as needed for wheezing or shortness of breath. 1 Inhaler 2  . doxycycline (VIBRAMYCIN) 50 MG capsule Take 2 capsules (100 mg total) by mouth 2 (two) times daily. 20 capsule 0  . guaiFENesin-codeine 100-10 MG/5ML syrup Take 5 mLs by mouth at bedtime as needed for cough. 120 mL 0  . ipratropium (ATROVENT) 0.06 % nasal spray Place 2 sprays into both nostrils 4 (four) times daily. 15 mL 1  . lisinopril (PRINIVIL,ZESTRIL) 40 MG tablet Take 40 mg by mouth daily.    . metFORMIN (GLUCOPHAGE) 500 MG tablet Take 500 mg by mouth daily with breakfast.    . Phentermine-Topiramate (QSYMIA) 7.5-46 MG CP24 Take by mouth. Pt takes one tablet daily    . predniSONE (DELTASONE) 10 MG tablet Take 3 tablets (30 mg total) by mouth daily.  15 tablet 0  . Vitamin D, Ergocalciferol, (DRISDOL) 50000 UNITS CAPS capsule Take 50,000 Units by mouth every 7 (seven) days. Takes 1.25 mg once weekly    . [DISCONTINUED] fluticasone (FLONASE) 50 MCG/ACT nasal spray Place 2 sprays into the nose daily. 16 g 0   Allergies  Allergen Reactions  . Aspirin Hives  . Morphine And Related Other (See Comments)    Patient states" the Dr told me not to take it because it drops my blood pressure".   . Penicillins Hives     Exam:  BP 139/89 mmHg  Pulse 93  Temp(Src) 98.4 F (36.9 C) (Oral)  Resp 20  SpO2 99%  LMP 09/27/2014 Gen: Well NAD HEENT: EOMI,  MMM posterior pharynx with cobblestoning. Normal tympanic membranes bilaterally. Lungs: Normal work of breathing. CTABL Heart: RRR no MRG Abd: NABS, Soft. Nondistended, Nontender Exts: Brisk capillary refill, warm and well perfused.   Patient was given a 2.5/0.5 mg DuoNeb nebulizer treatment and felt much better.  No results found for this or any previous visit (from the past 24 hour(s)). No results found.  Assessment and Plan: 49 y.o. female with bronchitis. Treat with prednisone, albuterol inhaler, Atrovent nasal spray, and codeine cough syrup.  Discussed warning signs or symptoms. Please see discharge instructions. Patient expresses understanding.     Rodolph BongEvan S Bayan Hedstrom, MD 09/27/14 [phone]

## 2014-12-02 NOTE — Telephone Encounter (Signed)
Her Ins is [employer]  Name on the card - Melinda Gregory    Member ID : [id number]

## 2014-12-02 NOTE — Telephone Encounter (Signed)
How patient heard about [place]: Her Ins h Dr. Everardo Beals for a PCP     Previous PCP: Dr. Nita Sells in [place]    Current meds: Lisinopril, Metformin, Zoloft, Contrave     Needs appt for: Med refill, Needs PCP

## 2015-01-01 ENCOUNTER — Ambulatory Visit
Admit: 2015-01-01 | Discharge: 2015-01-01 | Payer: PRIVATE HEALTH INSURANCE | Attending: Family Medicine | Primary: Family Medicine

## 2015-01-01 DIAGNOSIS — E119 Type 2 diabetes mellitus without complications: Secondary | ICD-10-CM

## 2015-01-01 LAB — CBC, POC
ABS. GRANULOCYTES: 5 10*3/uL (ref 2.0–7.8)
ABS. LYMPHOCYTES: 1 10*3/uL (ref 0.7–3.1)
ABS. MONOCYTES: 0.1 10*3/uL (ref 0.1–1.1)
GRANULOCYTES: 80.7 % (ref 37.0–92.0)
HCT: 39.6 % (ref 35.0–48.0)
HGB: 12.9 g/dL (ref 12.0–16.0)
LYMPHOCYTES: 16.4 % — ABNORMAL LOW (ref 20.5–51.1)
MCH: 27.9 pg (ref 27.0–34.0)
MCHC: 32.5 g/dL (ref 31.0–36.0)
MCV: 86 fL (ref 80–100)
MEAN PLATELET VOLUME: 7.8 fL (ref 6.9–10.4)
MONOCYTES: 2.9 % — ABNORMAL LOW (ref 3.0–10.0)
PLATELET: 205 10*3/uL (ref 140–440)
RBC: 4.62 M/uL (ref 3.80–5.40)
RDW: 14.5 % (ref 11.0–16.0)
WBC: 6.1 10*3/uL (ref 4.0–11.0)

## 2015-01-01 LAB — HEMOGLOBIN A1C, POC: Hemoglobin A1c: 5.7 % (ref 4.2–6.0)

## 2015-01-01 MED ORDER — BENAZEPRIL 40 MG TAB
40 mg | ORAL_TABLET | Freq: Every day | ORAL | 12 refills | Status: DC
Start: 2015-01-01 — End: 2015-03-31

## 2015-01-01 MED ORDER — SERTRALINE 100 MG TAB
100 mg | ORAL_TABLET | Freq: Every day | ORAL | 12 refills | Status: DC
Start: 2015-01-01 — End: 2015-03-31

## 2015-01-01 MED ORDER — DULAGLUTIDE 0.75 MG/0.5 ML SUBCUTANEOUS PEN INJECTOR
0.75 mg/0.5 mL | PEN_INJECTOR | SUBCUTANEOUS | 3 refills | Status: DC
Start: 2015-01-01 — End: 2015-03-31

## 2015-01-01 MED ORDER — METFORMIN 500 MG TAB
500 mg | ORAL_TABLET | ORAL | 12 refills | Status: DC
Start: 2015-01-01 — End: 2015-03-31

## 2015-01-01 NOTE — Progress Notes (Signed)
01/01/2015      Melinda Gregory  02-23-1966  [address]  [place] [address]  [phone] (home)       Chief Complaint   Patient presents with   ??? Establish Care         SUBJECTIVE     HPI:  Melinda Gregory is a 49 y.o. female here for eval of:     New patient, recently moved from [place], [place].  Hx of AODM and HTN. BS largely controlled and doing well with no new specific concerns today      ROS    Review of Systems is otherwise negative except as above.    No current outpatient prescriptions on file prior to visit.     No current facility-administered medications on file prior to visit.        Allergies   Allergen Reactions   ??? Aspirin Hives   ??? Morphine Other (comments)     Drops BP   ??? Pcn [Penicillins] Hives       Past Medical History   Diagnosis Date   ??? Diabetes (HCC)    ??? Hypertension        Past Surgical History   Procedure Laterality Date   ??? Hx gi  1996     Lap-band     starting weight 280, lost 100 lbs but regained   ??? Hx colonoscopy  2015     no polyps       No family history on file.     OBJECTIVE       LABS/RADS  Results for orders placed or performed in visit on 01/01/15   CBC, POC   Result Value Ref Range    WBC 6.1 4.0 - 11.0 K/uL    RBC 4.62 3.80 - 5.40 M/uL    HGB 12.9 12.0 - 16.0 g/dL    HCT 82.9 56.2 - 13.0 %    MCV 86 80 - 100 fL    MCH 27.9 27.0 - 34.0 pg    MCHC 32.5 31.0 - 36.0 g/dL    PLATELET 865 784 - 696 K/uL    LYMPHOCYTES 16.4 (L) 20.5 - 51.1 %    MONOCYTES 2.9 (L) 3.0 - 10.0 %    GRANULOCYTES 80.7 37.0 - 92.0 %    ABS. LYMPHOCYTES 1.0 0.7 - 3.1 K/uL    ABS. MONOCYTES 0.1 0.1 - 1.1 K/uL    ABS. GRANULOCYTES 5.0 2.0 - 7.8 K/uL    RDW 14.5 11.0 - 16.0 %    MEAN PLATELET VOLUME 7.8 6.9 - 10.4 fL       PHYSICAL EXAM  Visit Vitals   ??? BP 156/90 (BP 1 Location: Left arm, BP Patient Position: Sitting)   ??? Pulse 86   ??? Temp 98.5 ??F (36.9 ??C) (Oral)   ??? Ht 5' 3.3" (1.608 m)   ??? Wt 275 lb 3.2 oz (124.8 kg)   ??? LMP 12/22/2014   ??? BMI 48.29 kg/m2        Visual Acuity: No exam data present  Physical Exam   Constitutional: She is oriented to person, place, and time and well-developed, well-nourished, and in no distress.   Generalized obesity     HENT:   Head: Normocephalic.   Eyes: Conjunctivae and EOM are normal. Pupils are equal, round, and reactive to light.   Neck: Normal range of motion. Neck supple.   Cardiovascular: Normal rate, regular rhythm and normal heart sounds.    Pulmonary/Chest: Effort normal and  breath sounds normal.   Abdominal: Soft. Bowel sounds are normal.   Musculoskeletal: Normal range of motion.   Neurological: She is alert and oriented to person, place, and time.   Diabetic foot exam normal. Normal circulation, no ulcerations, normal sensation to light touch, no callus formation     Skin: Skin is warm and dry.   Psychiatric: Mood, memory, affect and judgment normal.       ASSESSMENT / PLAN   1. Diabetes mellitus type 2, controlled (HCC)  Continue Metformin, begin Trulicity  - CBC - POC (16109); Future  - CMP (60454); Future  - Hemoglobin A1C POC (83036); Future  - Lipid panel w LDL/HDL ratio (80061); Future  - TSH [phone]); Future  - CBC - POC (91478)  - CMP (80053)  - Hemoglobin A1C POC (29562)  - Lipid panel w LDL/HDL ratio (80061)  - TSH (13086)    2. Vitamin D deficiency  Hx of borderline levels. Check today  - Vitamin D, 25-Hydroxy (57846); Future  - Vitamin D, 25-Hydroxy (82306)    3. Benign essential HTN  Change Lisinopril to Benazepril    4. Encounter for immunization  given  - Tdap    Greater than 50 % of this 25 minute visit was spent counseling and discussing with the patient about the diagnoses as above  Return for CPX    Follow-up Disposition:  Return in about 3 months (around 04/03/2015).    Continue current medications and treatment plan  Maintain prudent diet and exercise regimen  Routine health maintenance for age recommendations shared  Call or return earlier for any additional concerns. Call 911 or go to ER  if urgent symptoms develop in the interim.      Current Outpatient Prescriptions:   ???  cyanocobalamin (VITAMIN B-12) 1,000 mcg tablet, Take 1,000 mcg by mouth daily., Disp: , Rfl:   ???  benazepril (LOTENSIN) 40 mg tablet, Take 1 Tab by mouth daily. Indications: HYPERTENSION, Disp: 90 Tab, Rfl: 12  ???  metFORMIN (GLUCOPHAGE) 500 mg tablet, 1 bid for diabetes, Disp: 180 Tab, Rfl: 12  ???  sertraline (ZOLOFT) 100 mg tablet, Take 1 Tab by mouth daily., Disp: 90 Tab, Rfl: 12  ???  dulaglutide (TRULICITY) 0.75 mg/0.5 mL sub-q pen, 0.5 mL by SubCUTAneous route every seven (7) days. Indications: TYPE 2 DIABETES MELLITUS, Disp: 12 Pen, Rfl: 3    Norine Reddington L. Talmadge Chad, MD       (electronic signature)    [facility]  [address]  [place], [place] [address]

## 2015-01-02 ENCOUNTER — Ambulatory Visit (INDEPENDENT_AMBULATORY_CARE_PROVIDER_SITE_OTHER): Payer: Commercial Managed Care - PPO | Admitting: Pulmonary Disease

## 2015-01-02 ENCOUNTER — Ambulatory Visit: Payer: Commercial Managed Care - PPO | Admitting: Pulmonary Disease

## 2015-01-02 ENCOUNTER — Encounter: Payer: Self-pay | Admitting: Pulmonary Disease

## 2015-01-02 VITALS — BP 122/80 | HR 88 | Ht 63.0 in | Wt 277.2 lb

## 2015-01-02 DIAGNOSIS — Z9989 Dependence on other enabling machines and devices: Secondary | ICD-10-CM

## 2015-01-02 DIAGNOSIS — Z6841 Body Mass Index (BMI) 40.0 and over, adult: Secondary | ICD-10-CM

## 2015-01-02 DIAGNOSIS — G4733 Obstructive sleep apnea (adult) (pediatric): Secondary | ICD-10-CM

## 2015-01-02 DIAGNOSIS — R0683 Snoring: Secondary | ICD-10-CM | POA: Diagnosis not present

## 2015-01-02 LAB — METABOLIC PANEL, COMPREHENSIVE
A-G Ratio: 1.6 (ref 1.1–2.5)
ALT (SGPT): 9 IU/L (ref 0–32)
AST (SGOT): 10 IU/L (ref 0–40)
Albumin: 3.9 g/dL (ref 3.5–5.5)
Alk. phosphatase: 59 IU/L (ref 39–117)
BUN/Creatinine ratio: 15 (ref 9–23)
BUN: 15 mg/dL (ref 6–24)
Bilirubin, total: 0.4 mg/dL (ref 0.0–1.2)
CO2: 25 mmol/L (ref 18–29)
Calcium: 8.8 mg/dL (ref 8.7–10.2)
Chloride: 98 mmol/L (ref 97–108)
Creatinine: 0.98 mg/dL (ref 0.57–1.00)
GFR est AA: 77 mL/min/{1.73_m2} (ref >59)
GFR est non-AA: 67 mL/min/{1.73_m2} (ref >59)
GLOBULIN, TOTAL: 2.4 g/dL (ref 1.5–4.5)
Glucose: 133 mg/dL — ABNORMAL HIGH (ref 65–99)
Potassium: 3.8 mmol/L (ref 3.5–5.2)
Protein, total: 6.3 g/dL (ref 6.0–8.5)
Sodium: 142 mmol/L (ref 134–144)

## 2015-01-02 LAB — LIPID PANEL WITH LDL/HDL RATIO
Cholesterol, total: 187 mg/dL (ref 100–199)
HDL Cholesterol: 56 mg/dL (ref >39)
LDL, calculated: 117 mg/dL — ABNORMAL HIGH (ref 0–99)
LDL/HDL Ratio: 2.1 ratio units (ref 0.0–3.2)
Triglyceride: 68 mg/dL (ref 0–149)
VLDL, calculated: 14 mg/dL (ref 5–40)

## 2015-01-02 LAB — TSH 3RD GENERATION: TSH: 5.25 u[IU]/mL — ABNORMAL HIGH (ref 0.450–4.500)

## 2015-01-02 LAB — VITAMIN D, 25 HYDROXY: VITAMIN D, 25-HYDROXY: 28.9 ng/mL — ABNORMAL LOW (ref 30.0–100.0)

## 2015-01-02 NOTE — Patient Instructions (Signed)
Follow up in 1 year.

## 2015-01-02 NOTE — Progress Notes (Signed)
Chief Complaint  Patient presents with  . Follow-up    Wears CPAP nightly. Denies problems with mask or pressure. Patient needs new DME - pt has moved to [place], [place]    History of Present Illness: Virginia Jordan is a 49 y.o. female with OSA.  Since her last visit she had sleep study.  This showed moderate sleep apnea.  She was started on CPAP.  She is sleeping much better.  She has full face mask.  She is more rested during the day.  She has moved to [place], [place].   TESTS: HST 04/17/14 >> AHI 16.7, SaO2 low 78% Auto CPAP 12/03/14 to 01/01/15 >> used on 30 of 30 nights with average 5 hrs and 58 min.  Average AHI is 5.4 with median CPAP 12 cm H2O and 95 th percentile CPAP 14 cm H20.   Past medical hx >> HTN, DM, Vertigo  Past surgical hx, Medications, Allergies, Family hx, Social hx all reviewed.   Physical Exam: BP 122/80 mmHg  Pulse 88  Ht  (1.6 m)  Wt 277 lb 3.2 oz (125.737 kg)  BMI 49.12 kg/m2  SpO2 97%  General - No distress ENT - No sinus tenderness, no oral exudate, no LAN, MP 3, scalloped tongue Cardiac - s1s2 regular, no murmur Chest - No wheeze/rales/dullness Back - No focal tenderness Abd - Soft, non-tender Ext - No edema Neuro - Normal strength Skin - No rashes Psych - normal mood, and behavior   Assessment/Plan:  Obstructive sleep apnea. She is compliant with CPAP and reports benefits. Plan: - continue auto CPAP - she will try to get sleep doctor in [place]  Obesity. Plan: - discussed options to assist with weight loss   Coralyn Helling, MD [facility] Pager:  [phone]

## 2015-01-02 NOTE — Progress Notes (Signed)
Print out labs to send to pt.

## 2015-01-10 IMAGING — MG MM DIGITAL SCREENING BILAT W/ CAD
5 series · 5 of 5 positions shown · non-contrast
Comparison: Previous exam(s).

CLINICAL DATA: Screening.

EXAM:
DIGITAL SCREENING BILATERAL MAMMOGRAM WITH CAD

[L CC]
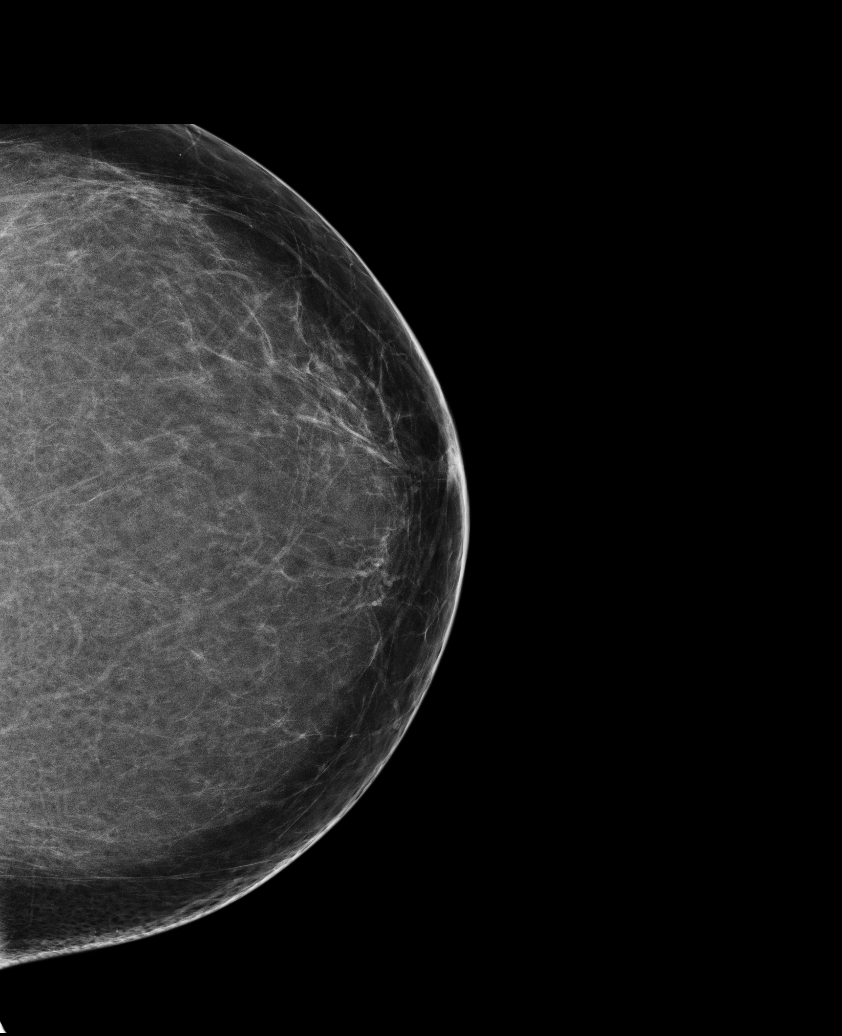

[L MLO]
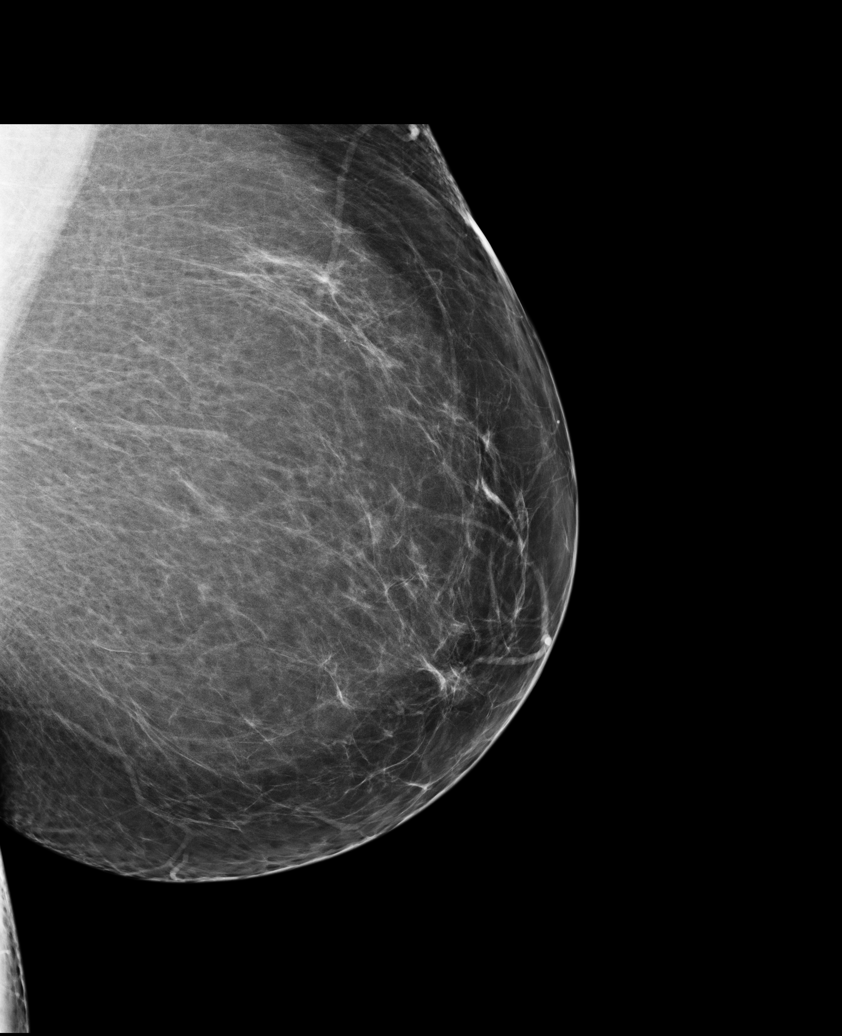

[R CC]
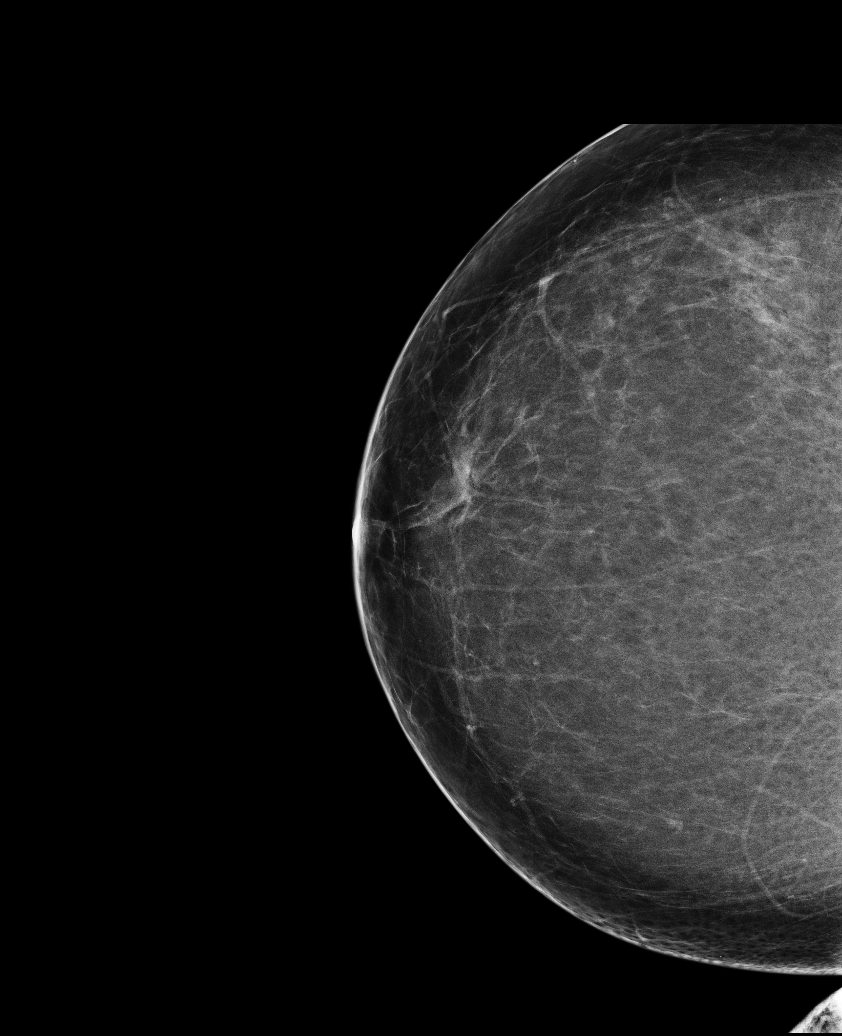

[R MLO (1 of 2)]
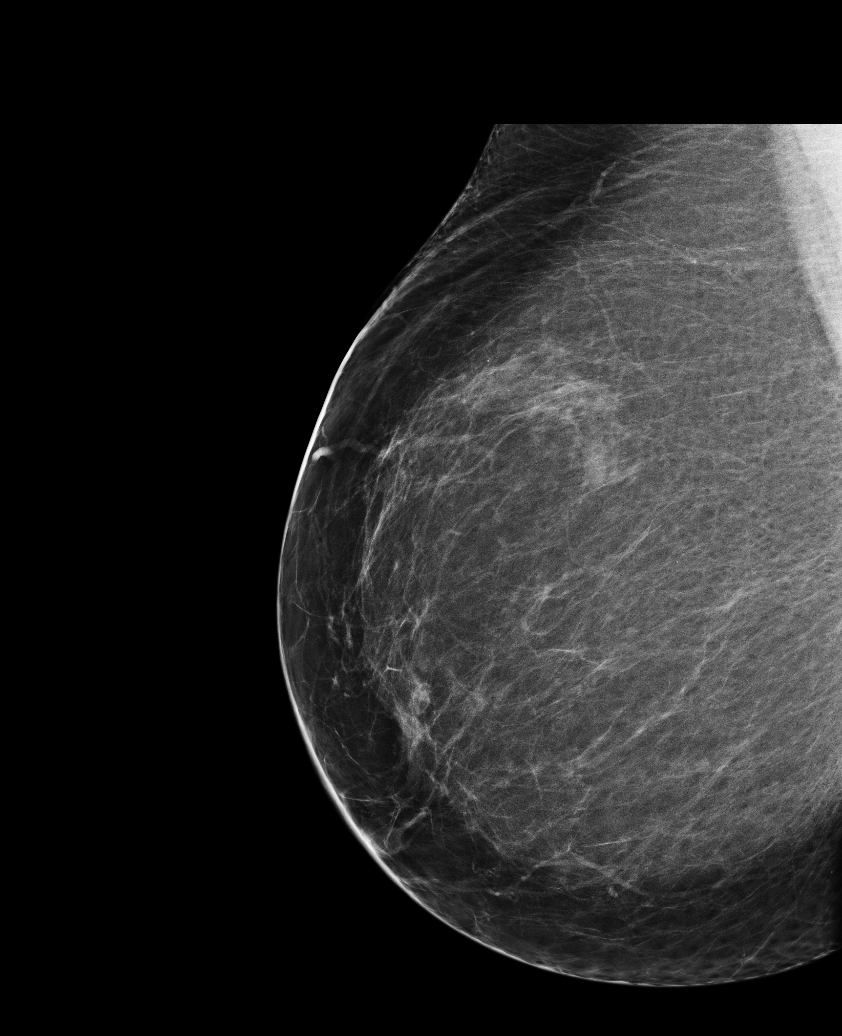

[R MLO (2 of 2)]
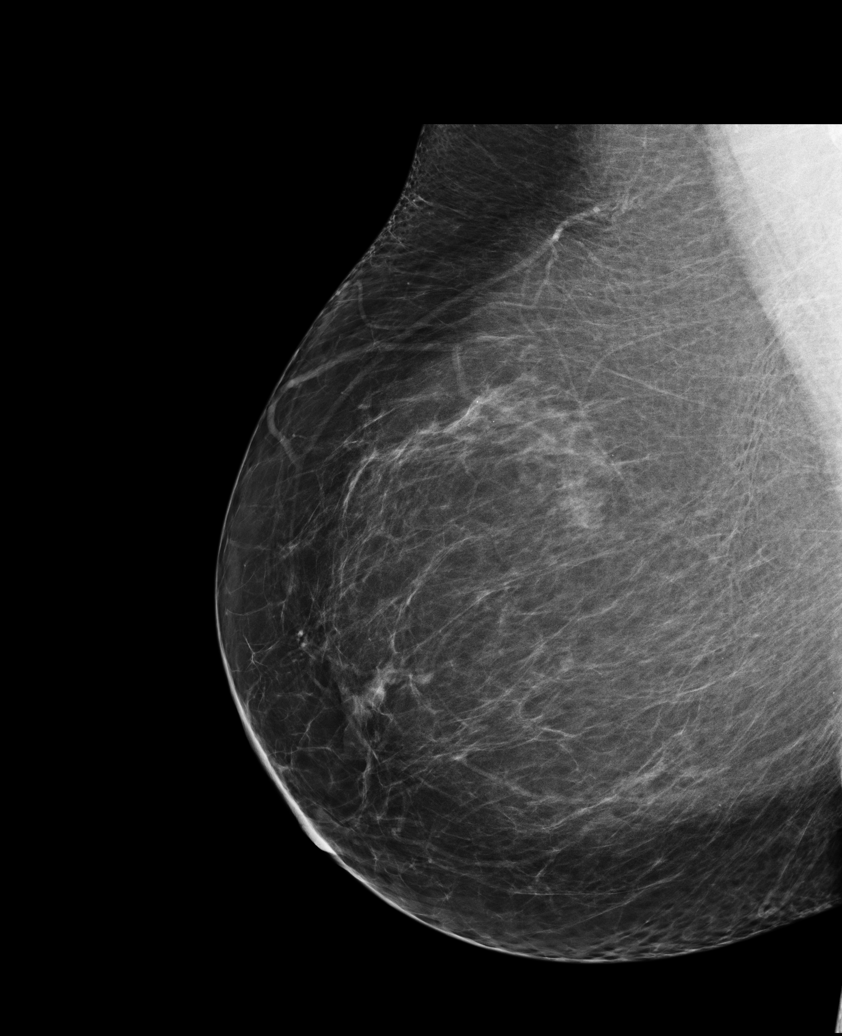

[5 of 5 positions shown; findings below may reference images not displayed]

ACR Breast Density Category b: There are scattered areas of
fibroglandular density.
FINDINGS: There are no findings suspicious for malignancy. Images were
processed with CAD.
IMPRESSION: No mammographic evidence of malignancy. A result letter of this
screening mammogram will be mailed directly to the patient.

RECOMMENDATION:
Screening mammogram in one year. (Code:AS-G-LCT)

BI-RADS CATEGORY  1: Negative.

## 2015-03-31 ENCOUNTER — Ambulatory Visit
Admit: 2015-03-31 | Discharge: 2015-03-31 | Payer: PRIVATE HEALTH INSURANCE | Attending: Family Medicine | Primary: Family Medicine

## 2015-03-31 DIAGNOSIS — E119 Type 2 diabetes mellitus without complications: Secondary | ICD-10-CM

## 2015-03-31 LAB — HEMOGLOBIN A1C, POC: Hemoglobin A1c: 5.5 % (ref 4.2–6.0)

## 2015-03-31 MED ORDER — DULAGLUTIDE 0.75 MG/0.5 ML SUBCUTANEOUS PEN INJECTOR
0.75 mg/0.5 mL | PEN_INJECTOR | SUBCUTANEOUS | 3 refills | Status: DC
Start: 2015-03-31 — End: 2015-06-18

## 2015-03-31 MED ORDER — BENAZEPRIL 40 MG TAB
40 mg | ORAL_TABLET | Freq: Every day | ORAL | 12 refills | Status: DC
Start: 2015-03-31 — End: 2015-12-10

## 2015-03-31 MED ORDER — SERTRALINE 100 MG TAB
100 mg | ORAL_TABLET | Freq: Every day | ORAL | 12 refills | Status: DC
Start: 2015-03-31 — End: 2015-10-07

## 2015-03-31 MED ORDER — AMLODIPINE 5 MG TAB
5 mg | ORAL_TABLET | ORAL | 9 refills | Status: DC
Start: 2015-03-31 — End: 2015-12-10

## 2015-03-31 MED ORDER — CANAGLIFLOZIN 150 MG-METFORMIN ER 500 MG TABLET,EXTENDED RELEASE 24 HR
150-500 mg | ORAL_TABLET | Freq: Every day | ORAL | 12 refills | Status: DC
Start: 2015-03-31 — End: 2015-12-10

## 2015-03-31 NOTE — Progress Notes (Signed)
03/31/2015      Melinda Gregory  1965/11/22  [address]  [place] [address]  [phone] (home)       Chief Complaint   Patient presents with   ??? Follow-up     blood sugar    130         SUBJECTIVE     HPI:  Melinda Gregory is a 49 y.o. female here for eval of:     AODM: Recheck of diabetes. Stable on current regimen. Sts Blood sugar typically less than 150. No hyperglycemic symptoms. No reports of hypoglycemic episodes.  No reports of adverse drug interactions. Remains compliant with follow-up.    Hypertension: recheck of HTN. Doing well. Remains compliant with medications. No chest pain, SOB, edema or dizziness. States that normotensive at home.       ROS    Review of Systems is otherwise negative except as above.    Current Outpatient Prescriptions on File Prior to Visit   Medication Sig Dispense Refill   ??? cyanocobalamin (VITAMIN B-12) 1,000 mcg tablet Take 1,000 mcg by mouth daily.       No current facility-administered medications on file prior to visit.        Allergies   Allergen Reactions   ??? Aspirin Hives   ??? Morphine Other (comments)     Drops BP   ??? Pcn [Penicillins] Hives       Past Medical History   Diagnosis Date   ??? Diabetes (HCC)    ??? Hypertension        Past Surgical History   Procedure Laterality Date   ??? Hx gi  1996     Lap-band     starting weight 280, lost 100 lbs but regained   ??? Hx colonoscopy  2015     no polyps       No family history on file.     OBJECTIVE       LABS/RADS  Results for orders placed or performed in visit on 03/31/15   HEMOGLOBIN A1C, POC   Result Value Ref Range    Hemoglobin A1c 5.5 4.2 - 6.0 %       PHYSICAL EXAM  Visit Vitals   ??? BP 150/90 (BP 1 Location: Left arm)   ??? Temp 98.1 ??F (36.7 ??C)   ??? Wt 277 lb (125.6 kg)   ??? BMI 48.6 kg/m2       Visual Acuity: No exam data present  Physical Exam   Constitutional: She is oriented to person, place, and time and well-developed, well-nourished, and in no distress.   HENT:   Head: Normocephalic.    Eyes: Conjunctivae and EOM are normal. Pupils are equal, round, and reactive to light.   Neck: Normal range of motion. Neck supple.   Cardiovascular: Normal rate, regular rhythm and normal heart sounds.    Pulmonary/Chest: Effort normal and breath sounds normal.   Abdominal: Soft. Bowel sounds are normal.   Musculoskeletal: Normal range of motion.   Neurological: She is alert and oriented to person, place, and time.   Skin: Skin is warm and dry.   Psychiatric: Mood, memory, affect and judgment normal.       ASSESSMENT / PLAN   1. Controlled type 2 diabetes mellitus without complication, without long-term current use of insulin (HCC)  Change to Invokamet  - REFERRAL TO DIABETIC EDUCATION  - CMP (91478); Future  - Hemoglobin A1C POC (83036); Future  - TSH [phone]); Future  - CMP (13086)  -  Hemoglobin A1C POC (83036)  - TSH (84443)    2. Benign essential HTN  controlled    Greater than 50 % of this 25 minute visit was spent counseling and discussing with the patient about the diagnoses as above  Follow-up Disposition:  Return in about 3 months (around 07/01/2015).    Continue current medications and treatment plan  Maintain prudent diet and exercise regimen  Routine health maintenance for age recommendations shared  Call or return earlier for any additional concerns. Call 911 or go to ER if urgent symptoms develop in the interim.      Current Outpatient Prescriptions:   ???  canagliflozin-metformin (INVOKAMET XR) 150-500 mg TBph, Take 1 Tab by mouth daily. Indications: type 2 diabetes mellitus, Disp: 90 Tab, Rfl: 12  ???  benazepril (LOTENSIN) 40 mg tablet, Take 1 Tab by mouth daily. Indications: Hypertension, Disp: 90 Tab, Rfl: 12  ???  sertraline (ZOLOFT) 100 mg tablet, Take 1 Tab by mouth daily., Disp: 90 Tab, Rfl: 12  ???  dulaglutide (TRULICITY) 0.75 mg/0.5 mL sub-q pen, 0.5 mL by SubCUTAneous route every seven (7) days. Indications: type 2 diabetes mellitus, Disp: 12 Pen, Rfl: 3   ???  amLODIPine (NORVASC) 5 mg tablet, 1 qd  Indications: Hypertension, Disp: 90 Tab, Rfl: 9  ???  cyanocobalamin (VITAMIN B-12) 1,000 mcg tablet, Take 1,000 mcg by mouth daily., Disp: , Rfl:     Jaime Dome L. Talmadge ChadBridgeman, Jr, MD       (electronic signature)    [facility]  [address]  [place], [place] [id number]

## 2015-04-01 LAB — METABOLIC PANEL, COMPREHENSIVE
A-G Ratio: 1.6 (ref 1.1–2.5)
ALT (SGPT): 9 IU/L (ref 0–32)
AST (SGOT): 14 IU/L (ref 0–40)
Albumin: 4.3 g/dL (ref 3.5–5.5)
Alk. phosphatase: 85 IU/L (ref 39–117)
BUN/Creatinine ratio: 10 (ref 9–23)
BUN: 9 mg/dL (ref 6–24)
Bilirubin, total: 0.5 mg/dL (ref 0.0–1.2)
CO2: 29 mmol/L (ref 18–29)
Calcium: 9.3 mg/dL (ref 8.7–10.2)
Chloride: 99 mmol/L (ref 97–106)
Creatinine: 0.88 mg/dL (ref 0.57–1.00)
GFR est AA: 88 mL/min/{1.73_m2} (ref >59)
GFR est non-AA: 76 mL/min/{1.73_m2} (ref >59)
GLOBULIN, TOTAL: 2.7 g/dL (ref 1.5–4.5)
Glucose: 101 mg/dL — ABNORMAL HIGH (ref 65–99)
Potassium: 4.2 mmol/L (ref 3.5–5.2)
Protein, total: 7 g/dL (ref 6.0–8.5)
Sodium: 142 mmol/L (ref 136–144)

## 2015-04-01 LAB — TSH 3RD GENERATION: TSH: 3.15 u[IU]/mL (ref 0.450–4.500)

## 2015-04-06 NOTE — Progress Notes (Signed)
Print out labs to send to pt.

## 2015-04-15 NOTE — Telephone Encounter (Signed)
Please call pt regarding labs done on 11.22.16.

## 2015-04-20 NOTE — Telephone Encounter (Signed)
I called to get pt's updated address but her vm box has not been set up.

## 2015-06-18 ENCOUNTER — Ambulatory Visit
Admit: 2015-06-18 | Discharge: 2015-06-18 | Payer: PRIVATE HEALTH INSURANCE | Attending: Family Medicine | Primary: Family Medicine

## 2015-06-18 DIAGNOSIS — E119 Type 2 diabetes mellitus without complications: Secondary | ICD-10-CM

## 2015-06-18 LAB — HEMOGLOBIN A1C, POC: Hemoglobin A1c: 6 % (ref 4.2–6.0)

## 2015-06-18 LAB — CBC, POC
ABS. GRANULOCYTES: 6.4 10*3/uL (ref 2.0–7.8)
ABS. LYMPHOCYTES: 1.2 10*3/uL (ref 0.7–3.1)
ABS. MONOCYTES: 0.2 10*3/uL (ref 0.1–1.1)
GRANULOCYTES: 79.8 % (ref 37.0–92.0)
HCT: 40.3 % (ref 35.0–48.0)
HGB: 12.9 g/dL (ref 12.0–16.0)
LYMPHOCYTES: 16.6 % — ABNORMAL LOW (ref 20.5–51.1)
MCH: 26 pg — ABNORMAL LOW (ref 27.0–34.0)
MCHC: 32.2 g/dL (ref 31.0–36.0)
MCV: 81 fL (ref 80–100)
MEAN PLATELET VOLUME: 7.1 fL (ref 6.9–10.4)
MONOCYTES: 3.6 % (ref 3.0–10.0)
PLATELET: 238 10*3/uL (ref 140–440)
RBC: 4.98 M/uL (ref 3.80–5.40)
RDW: 15 % (ref 11.0–16.0)
WBC: 7.8 10*3/uL (ref 4.0–11.0)

## 2015-06-18 MED ORDER — DULAGLUTIDE 1.5 MG/0.5 ML SUBCUTANEOUS PEN INJECTOR
1.5 mg/0.5 mL | PEN_INJECTOR | SUBCUTANEOUS | 12 refills | Status: DC
Start: 2015-06-18 — End: 2016-03-30

## 2015-06-18 NOTE — Progress Notes (Signed)
06/18/2015      Melinda Gregory  1965-09-03  [address]  [place] [address]  [phone] (home)       Chief Complaint   Patient presents with   ??? Other     blood sugar 147         SUBJECTIVE     HPI:  Melinda Gregory is a 50 y.o. female here for eval of:     AODM: Recheck of diabetes. Stable on current regimen. Sts Blood sugar typically less than 150. No hyperglycemic symptoms. No reports of hypoglycemic episodes.  No reports of adverse drug interactions. Remains compliant with follow-up.    Hypertension: recheck of HTN. Doing well. Remains compliant with medications. No chest pain, SOB, edema or dizziness. States that normotensive at home.     No chest pain, SOB or change in bowel or bladder habits  No other new concerns.    ROS    Review of Systems is otherwise negative except as above.    Current Outpatient Prescriptions on File Prior to Visit   Medication Sig Dispense Refill   ??? canagliflozin-metformin (INVOKAMET XR) 150-500 mg TBph Take 1 Tab by mouth daily. Indications: type 2 diabetes mellitus 90 Tab 12   ??? benazepril (LOTENSIN) 40 mg tablet Take 1 Tab by mouth daily. Indications: Hypertension 90 Tab 12   ??? sertraline (ZOLOFT) 100 mg tablet Take 1 Tab by mouth daily. 90 Tab 12   ??? amLODIPine (NORVASC) 5 mg tablet 1 qd  Indications: Hypertension 90 Tab 9   ??? cyanocobalamin (VITAMIN B-12) 1,000 mcg tablet Take 1,000 mcg by mouth daily.       No current facility-administered medications on file prior to visit.        Allergies   Allergen Reactions   ??? Aspirin Hives   ??? Morphine Other (comments)     Drops BP   ??? Pcn [Penicillins] Hives       Past Medical History   Diagnosis Date   ??? Diabetes (HCC)    ??? Hypertension        Past Surgical History   Procedure Laterality Date   ??? Hx gi  1996     Lap-band     starting weight 280, lost 100 lbs but regained   ??? Hx colonoscopy  2015     no polyps       No family history on file.     OBJECTIVE       LABS/RADS   Results for orders placed or performed in visit on 06/18/15   CBC, POC   Result Value Ref Range    WBC 7.8 4.0 - 11.0 K/uL    RBC 4.98 3.80 - 5.40 M/uL    HGB 12.9 12.0 - 16.0 g/dL    HCT 06.3 01.6 - 01.0 %    MCV 81 80 - 100 fL    MCH 26.0 (L) 27.0 - 34.0 pg    MCHC 32.2 31.0 - 36.0 g/dL    PLATELET 932 355 - 732 K/uL    LYMPHOCYTES 16.6 (L) 20.5 - 51.1 %    MONOCYTES 3.6 3.0 - 10.0 %    GRANULOCYTES 79.8 37.0 - 92.0 %    ABS. LYMPHOCYTES 1.2 0.7 - 3.1 K/uL    ABS. MONOCYTES 0.2 0.1 - 1.1 K/uL    ABS. GRANULOCYTES 6.4 2.0 - 7.8 K/uL    RDW 15.0 11.0 - 16.0 %    MEAN PLATELET VOLUME 7.1 6.9 - 10.4 fL  HEMOGLOBIN A1C, POC   Result Value Ref Range    Hemoglobin A1c 6.0 4.2 - 6.0 %       PHYSICAL EXAM  Visit Vitals   ??? BP 130/80 (BP 1 Location: Left arm)   ??? Temp 98.3 ??F (36.8 ??C)   ??? Wt 278 lb (126.1 kg)   ??? BMI 48.78 kg/m2       Visual Acuity: No exam data present  Physical Exam   Constitutional: She is oriented to person, place, and time and well-developed, well-nourished, and in no distress.   HENT:   Head: Normocephalic.   Eyes: Conjunctivae and EOM are normal. Pupils are equal, round, and reactive to light.   Neck: Normal range of motion. Neck supple.   Cardiovascular: Normal rate, regular rhythm and normal heart sounds.    Pulmonary/Chest: Effort normal and breath sounds normal.   Abdominal: Soft. Bowel sounds are normal.   Musculoskeletal: Normal range of motion.   Neurological: She is alert and oriented to person, place, and time.   Skin: Skin is warm and dry.   Psychiatric: Mood, memory, affect and judgment normal.       ASSESSMENT / PLAN   1. Controlled type 2 diabetes mellitus without complication, without long-term current use of insulin (HCC)  Log into MyChart in 4-5 days to check lab results. Notify this office if labs not present, or if there are additional questions or concerns  - CBC - POC (57846); Future  - CMP (96295); Future  - Hemoglobin A1C POC (83036); Future   - Lipid panel w LDL/HDL ratio (80061); Future  - TSH [phone]); Future  - CBC - POC (24401)  - CMP (80053)  - Hemoglobin A1C POC (02725)  - Lipid panel w LDL/HDL ratio (80061)  - TSH (36644)    2. Benign essential HTN  controlled    Greater than 50 % of this 25 minute visit was spent counseling and discussing with the patient about the diagnoses as above    Follow-up Disposition:  Return in about 6 months (around 12/16/2015).    Continue current medications and treatment plan  Maintain prudent diet and exercise regimen  Routine health maintenance for age recommendations shared  Call or return earlier for any additional concerns. Call 911 or go to ER if urgent symptoms develop in the interim.      Current Outpatient Prescriptions:   ???  dulaglutide (TRULICITY) 1.5 mg/0.5 mL sub-q pen, 0.5 mL by SubCUTAneous route every seven (7) days., Disp: 12 Pen, Rfl: 12  ???  canagliflozin-metformin (INVOKAMET XR) 150-500 mg TBph, Take 1 Tab by mouth daily. Indications: type 2 diabetes mellitus, Disp: 90 Tab, Rfl: 12  ???  benazepril (LOTENSIN) 40 mg tablet, Take 1 Tab by mouth daily. Indications: Hypertension, Disp: 90 Tab, Rfl: 12  ???  sertraline (ZOLOFT) 100 mg tablet, Take 1 Tab by mouth daily., Disp: 90 Tab, Rfl: 12  ???  amLODIPine (NORVASC) 5 mg tablet, 1 qd  Indications: Hypertension, Disp: 90 Tab, Rfl: 9  ???  cyanocobalamin (VITAMIN B-12) 1,000 mcg tablet, Take 1,000 mcg by mouth daily., Disp: , Rfl:     Pressley Tadesse L. Talmadge Chad, MD       (electronic signature)    [facility]  [address]  [place], [place] [address]

## 2015-06-19 LAB — LIPID PANEL WITH LDL/HDL RATIO
Cholesterol, total: 191 mg/dL (ref 100–199)
HDL Cholesterol: 38 mg/dL — ABNORMAL LOW (ref >39)
LDL, calculated: 111 mg/dL — ABNORMAL HIGH (ref 0–99)
LDL/HDL Ratio: 2.9 ratio units (ref 0.0–3.2)
Triglyceride: 210 mg/dL — ABNORMAL HIGH (ref 0–149)
VLDL, calculated: 42 mg/dL — ABNORMAL HIGH (ref 5–40)

## 2015-06-19 LAB — METABOLIC PANEL, COMPREHENSIVE
A-G Ratio: 1.6 (ref 1.1–2.5)
ALT (SGPT): 9 IU/L (ref 0–32)
AST (SGOT): 14 IU/L (ref 0–40)
Albumin: 4.2 g/dL (ref 3.5–5.5)
Alk. phosphatase: 74 IU/L (ref 39–117)
BUN/Creatinine ratio: 14 (ref 9–23)
BUN: 14 mg/dL (ref 6–24)
Bilirubin, total: 0.6 mg/dL (ref 0.0–1.2)
CO2: 22 mmol/L (ref 18–29)
Calcium: 9.4 mg/dL (ref 8.7–10.2)
Chloride: 100 mmol/L (ref 96–106)
Creatinine: 1.02 mg/dL — ABNORMAL HIGH (ref 0.57–1.00)
GFR est AA: 74 mL/min/{1.73_m2} (ref >59)
GFR est non-AA: 64 mL/min/{1.73_m2} (ref >59)
GLOBULIN, TOTAL: 2.6 g/dL (ref 1.5–4.5)
Glucose: 132 mg/dL — ABNORMAL HIGH (ref 65–99)
Potassium: 4.3 mmol/L (ref 3.5–5.2)
Protein, total: 6.8 g/dL (ref 6.0–8.5)
Sodium: 142 mmol/L (ref 134–144)

## 2015-06-19 LAB — TSH 3RD GENERATION: TSH: 1.57 u[IU]/mL (ref 0.450–4.500)

## 2015-06-22 NOTE — Progress Notes (Signed)
Print out labs to send to pt.

## 2015-08-04 NOTE — Telephone Encounter (Signed)
Melinda Gregory,   Patient was unaware that Dr. Everardo BealsBridgeman had referred her to the free diabetic class in November 2016.  She does want to go to the [facility].  Please refer her there.      Thank you,  Lincoln MaxinVeronica Salvador Bigbee, LPN

## 2015-10-07 ENCOUNTER — Encounter

## 2015-10-07 MED ORDER — SERTRALINE 100 MG TAB
100 mg | ORAL_TABLET | ORAL | 0 refills | Status: DC
Start: 2015-10-07 — End: 2015-12-10

## 2015-10-07 NOTE — Telephone Encounter (Signed)
Called pt  2:08pm    Not in  Her voice mail has not been set up to leave a message    Melinda Gregory

## 2015-10-07 NOTE — Telephone Encounter (Signed)
-----   Message from Jerelene ReddenJames L Bridgeman Jr., MD sent at 10/07/2015  9:45 AM EDT -----  Regarding: FW: Prescription Question  Contact: [phone]  Increase to 2 per day  Script for 1 month escribed  F/u 3-4 weeks    ----- Message -----     From: [name]     Sent: 10/07/2015   6:53 AM       To: Jerelene ReddenJames L Bridgeman Jr., MD  Subject: FW: Prescription Question                            ----- Message -----     From: [name]     Sent: 10/06/2015   9:24 PM       To: [facility]  Subject: Prescription Question                            My therapist has requested that I contact Dr. Everardo BealsBridgeman regarding increasing the dosage of Sertraline. Currently, I am taking 100 mg tablets 1x daily. Thanks for your help.

## 2015-10-22 ENCOUNTER — Encounter: Attending: Research Study | Primary: Family Medicine

## 2015-12-03 NOTE — Progress Notes (Signed)
Told patient to bring her wellness form with her at time of visit    Melinda Gregory

## 2015-12-10 ENCOUNTER — Ambulatory Visit
Admit: 2015-12-10 | Discharge: 2015-12-10 | Payer: PRIVATE HEALTH INSURANCE | Attending: Family Medicine | Primary: Family Medicine

## 2015-12-10 LAB — CBC, POC
ABS. GRANULOCYTES: 6.2 10*3/uL (ref 2.0–7.8)
ABS. LYMPHOCYTES: 1 10*3/uL (ref 0.7–3.1)
ABS. MONOCYTES: 0.2 10*3/uL (ref 0.1–1.1)
GRANULOCYTES: 82.4 % (ref 37.0–92.0)
HCT: 45.1 % (ref 35.0–48.0)
HGB: 14 g/dL (ref 12.0–16.0)
LYMPHOCYTES: 14.1 % — ABNORMAL LOW (ref 20.5–51.1)
MCH: 25.1 pg — ABNORMAL LOW (ref 27.0–34.0)
MCHC: 31.1 g/dL (ref 31.0–36.0)
MCV: 81 fL (ref 80–100)
MEAN PLATELET VOLUME: 7.4 fL (ref 6.9–10.4)
MONOCYTES: 3.5 % (ref 3.0–10.0)
PLATELET: 214 10*3/uL (ref 140–440)
RBC: 5.58 M/uL — ABNORMAL HIGH (ref 3.80–5.40)
RDW: 14.9 % (ref 11.0–16.0)
WBC: 7.4 10*3/uL (ref 4.0–11.0)

## 2015-12-10 MED ORDER — CANAGLIFLOZIN 150 MG-METFORMIN ER 500 MG TABLET,EXTENDED RELEASE 24 HR
150-500 mg | ORAL_TABLET | Freq: Every day | ORAL | 12 refills | Status: DC
Start: 2015-12-10 — End: 2016-06-13

## 2015-12-10 MED ORDER — SERTRALINE 100 MG TAB
100 mg | ORAL_TABLET | ORAL | 12 refills | Status: DC
Start: 2015-12-10 — End: 2016-06-13

## 2015-12-10 MED ORDER — AMLODIPINE 5 MG TAB
5 mg | ORAL_TABLET | ORAL | 9 refills | Status: DC
Start: 2015-12-10 — End: 2016-06-13

## 2015-12-10 MED ORDER — EXENATIDE ER 2 MG/0.65 ML SUBCUTANEOUS PEN INJECTOR
2 mg/0.65 mL | PEN_INJECTOR | SUBCUTANEOUS | 12 refills | Status: DC
Start: 2015-12-10 — End: 2016-06-13

## 2015-12-10 MED ORDER — BENAZEPRIL 40 MG TAB
40 mg | ORAL_TABLET | Freq: Every day | ORAL | 12 refills | Status: DC
Start: 2015-12-10 — End: 2016-06-13

## 2015-12-10 MED ORDER — PRAVASTATIN 20 MG TAB
20 mg | ORAL_TABLET | Freq: Every evening | ORAL | 12 refills | Status: DC
Start: 2015-12-10 — End: 2016-06-13
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
## 2015-12-11 LAB — HEMOGLOBIN A1C WITH EAG
Estimated average glucose: 134 mg/dL
Hemoglobin A1c: 6.3 % — ABNORMAL HIGH (ref 4.8–5.6)

## 2015-12-11 LAB — METABOLIC PANEL, COMPREHENSIVE
A-G Ratio: 1.4 (ref 1.2–2.2)
ALT (SGPT): 14 IU/L (ref 0–32)
AST (SGOT): 11 IU/L (ref 0–40)
Albumin: 4.2 g/dL (ref 3.5–5.5)
Alk. phosphatase: 82 IU/L (ref 39–117)
BUN/Creatinine ratio: 12 (ref 9–23)
BUN: 13 mg/dL (ref 6–24)
Bilirubin, total: 0.5 mg/dL (ref 0.0–1.2)
CO2: 23 mmol/L (ref 18–29)
Calcium: 9.4 mg/dL (ref 8.7–10.2)
Chloride: 100 mmol/L (ref 96–106)
Creatinine: 1.08 mg/dL — ABNORMAL HIGH (ref 0.57–1.00)
GFR est AA: 69 mL/min/{1.73_m2} (ref >59)
GFR est non-AA: 60 mL/min/{1.73_m2} (ref >59)
GLOBULIN, TOTAL: 2.9 g/dL (ref 1.5–4.5)
Glucose: 157 mg/dL — ABNORMAL HIGH (ref 65–99)
Potassium: 4.2 mmol/L (ref 3.5–5.2)
Protein, total: 7.1 g/dL (ref 6.0–8.5)
Sodium: 143 mmol/L (ref 134–144)

## 2015-12-11 LAB — TSH 3RD GENERATION: TSH: 3.26 u[IU]/mL (ref 0.450–4.500)

## 2015-12-11 LAB — LIPID PANEL WITH LDL/HDL RATIO
Cholesterol, total: 199 mg/dL (ref 100–199)
HDL Cholesterol: 47 mg/dL (ref >39)
LDL, calculated: 128 mg/dL — ABNORMAL HIGH (ref 0–99)
LDL/HDL Ratio: 2.7 ratio units (ref 0.0–3.2)
Triglyceride: 120 mg/dL (ref 0–149)
VLDL, calculated: 24 mg/dL (ref 5–40)

## 2015-12-11 NOTE — Progress Notes (Signed)
Print out labs to send to pt.

## 2015-12-14 ENCOUNTER — Encounter: Attending: Family Medicine | Primary: Family Medicine

## 2016-02-19 ENCOUNTER — Ambulatory Visit: Payer: Commercial Managed Care - PPO | Admitting: Pulmonary Disease

## 2016-03-30 ENCOUNTER — Ambulatory Visit
Admit: 2016-03-30 | Discharge: 2016-03-30 | Payer: PRIVATE HEALTH INSURANCE | Attending: Family Medicine | Primary: Family Medicine

## 2016-03-30 DIAGNOSIS — F330 Major depressive disorder, recurrent, mild: Secondary | ICD-10-CM

## 2016-03-30 MED ORDER — BUPROPION XL 150 MG 24 HR TAB
150 mg | ORAL_TABLET | ORAL | 1 refills | Status: DC
Start: 2016-03-30 — End: 2016-06-13

## 2016-03-30 NOTE — Progress Notes (Signed)
03/30/2016      Melinda Gregory  [birth year]  [address]  [place] [address]  [phone] (home)       Chief Complaint   Patient presents with   ??? Referral Request     Endocrinology   ??? Medication Evaluation     Wellbutrin         SUBJECTIVE     HPI:  Melinda Gregory is a 50 y.o. female here for eval of:     Depression: some improvement. Seeing a counselor who suggested adding Wellbutrin. No thoughts of harm to self or to others    AODM: Recheck of diabetes. Stable on current regimen. Sts Blood sugar typically less than 150. No hyperglycemic symptoms. No reports of hypoglycemic episodes.  No reports of adverse drug interactions. Remains compliant with follow-up.    Hypertension: recheck of HTN. Doing well. Remains compliant with medications. No chest pain, SOB, edema or dizziness. States that normotensive at home.     No chest pain, SOB or change in bowel or bladder habits  No other new concerns.    ROS    Review of Systems is otherwise negative except as above.    Current Outpatient Prescriptions on File Prior to Visit   Medication Sig Dispense Refill   ??? sertraline (ZOLOFT) 100 mg tablet 2 qd 180 Tab 12   ??? pravastatin (PRAVACHOL) 20 mg tablet Take 1 Tab by mouth nightly. 90 Tab 12   ??? canagliflozin-metformin (INVOKAMET XR) 150-500 mg TBph Take 1 Tab by mouth daily. Indications: type 2 diabetes mellitus 90 Tab 12   ??? benazepril (LOTENSIN) 40 mg tablet Take 1 Tab by mouth daily. Indications: hypertension 90 Tab 12   ??? amLODIPine (NORVASC) 5 mg tablet 1 qd  Indications: hypertension 90 Tab 9   ??? exenatide microspheres (BYDUREON) 2 mg/0.65 mL pnij 2 mg by SubCUTAneous route every seven (7) days. 12 Pen 12   ??? cyanocobalamin (VITAMIN B-12) 1,000 mcg tablet Take 1,000 mcg by mouth daily.       No current facility-administered medications on file prior to visit.        Allergies   Allergen Reactions   ??? Aspirin Hives   ??? Morphine Other (comments)     Drops BP   ??? Pcn [Penicillins] Hives    ??? Penicillin G Unknown (comments)     Potassium       Past Medical History:   Diagnosis Date   ??? Diabetes ([place])    ??? Hyperlipidemia    ??? Hypertension    ??? Morbid obesity ([place])        Past Surgical History:   Procedure Laterality Date   ??? HX COLONOSCOPY  2015    no polyps   ??? HX CYSTECTOMY  1991    Partial - foot   ??? HX GI  1996    Lap-band     starting weight 280, lost 100 lbs but regained   ??? HX OTHER SURGICAL  1989    Bypass       Family History   Problem Relation Age of Onset   ??? Diabetes Mother    ??? Asthma Mother    ??? Diabetes Father    ??? Diabetes Brother    ??? Diabetes Paternal Grandmother         OBJECTIVE       LABS/RADS  Results for orders placed or performed in visit on 12/10/15   CBC, POC   Result Value Ref Range  WBC 7.4 4.0 - 11.0 K/uL    RBC 5.58 (H) 3.80 - 5.40 M/uL    HGB 14.0 12.0 - 16.0 g/dL    HCT 45.1 35.0 - 48.0 %    MCV 81 80 - 100 fL    MCH 25.1 (L) 27.0 - 34.0 pg    MCHC 31.1 31.0 - 36.0 g/dL    PLATELET 214 140 - 440 K/uL    LYMPHOCYTES 14.1 (L) 20.5 - 51.1 %    MONOCYTES 3.5 3.0 - 10.0 %    GRANULOCYTES 82.4 37.0 - 92.0 %    ABS. LYMPHOCYTES 1.0 0.7 - 3.1 K/uL    ABS. MONOCYTES 0.2 0.1 - 1.1 K/uL    ABS. GRANULOCYTES 6.2 2.0 - 7.8 K/uL    RDW 14.9 11.0 - 16.0 %    MEAN PLATELET VOLUME 7.4 6.9 - 54.0 fL   METABOLIC PANEL, COMPREHENSIVE   Result Value Ref Range    Glucose 157 (H) 65 - 99 mg/dL    BUN 13 6 - 24 mg/dL    Creatinine 1.08 (H) 0.57 - 1.00 mg/dL    GFR est non-AA 60 >59 mL/min/1.73    GFR est AA 69 >59 mL/min/1.73    BUN/Creatinine ratio 12 9 - 23    Sodium 143 134 - 144 mmol/L    Potassium 4.2 3.5 - 5.2 mmol/L    Chloride 100 96 - 106 mmol/L    CO2 23 18 - 29 mmol/L    Calcium 9.4 8.7 - 10.2 mg/dL    Protein, total 7.1 6.0 - 8.5 g/dL    Albumin 4.2 3.5 - 5.5 g/dL    GLOBULIN, TOTAL 2.9 1.5 - 4.5 g/dL    A-G Ratio 1.4 1.2 - 2.2    Bilirubin, total 0.5 0.0 - 1.2 mg/dL    Alk. phosphatase 82 39 - 117 IU/L    AST (SGOT) 11 0 - 40 IU/L    ALT (SGPT) 14 0 - 32 IU/L    LIPID PANEL WITH LDL/HDL RATIO   Result Value Ref Range    Cholesterol, total 199 100 - 199 mg/dL    Triglyceride 120 0 - 149 mg/dL    HDL Cholesterol 47 >39 mg/dL    VLDL, calculated 24 5 - 40 mg/dL    LDL, calculated 128 (H) 0 - 99 mg/dL    LDL/HDL Ratio 2.7 0.0 - 3.2 ratio units   TSH 3RD GENERATION   Result Value Ref Range    TSH 3.260 0.450 - 4.500 uIU/mL   HEMOGLOBIN A1C WITH EAG   Result Value Ref Range    Hemoglobin A1c 6.3 (H) 4.8 - 5.6 %    Estimated average glucose 134 mg/dL       PHYSICAL EXAM  Visit Vitals   ??? BP (!) 139/96 (BP 1 Location: Left arm, BP Patient Position: Sitting)   ??? Pulse (!) 115   ??? Temp 97.8 ??F (36.6 ??C) (Oral)   ??? Ht '5\' 5"'  (1.651 m)   ??? Wt 276 lb (125.2 kg)   ??? SpO2 98%   ??? BMI 45.93 kg/m2       Visual Acuity: No exam data present  Physical Exam   Constitutional: She is oriented to person, place, and time and well-developed, well-nourished, and in no distress.   Morbid obesity     HENT:   Head: Normocephalic.   Eyes: Conjunctivae and EOM are normal. Pupils are equal, round, and reactive to light.   Neck: Normal range of motion.  Neck supple.   Cardiovascular: Normal rate, regular rhythm and normal heart sounds.    Pulmonary/Chest: Effort normal and breath sounds normal.   Abdominal: Soft. Bowel sounds are normal.   Musculoskeletal: Normal range of motion.   Neurological: She is alert and oriented to person, place, and time.   Skin: Skin is warm and dry.   Psychiatric: Mood, memory, affect and judgment normal.       ASSESSMENT / PLAN   1. Mild episode of recurrent major depressive disorder ([place])  Pathophysiology and treatment of above discussed at length with patient    Depression/anxiety/bipolar precautions: Pt cautioned to be alert for signs of worsening depression, agitation, irritability or thoughts of harm to self or to others. It these occur, patient understands to stop medication and notify this office. If significantly worsens, to go ER or [place].     Begin Wellbutrin 150 every day, can increase to 300 mg every day after a month      Greater than 50 % of this 15 minute visit was spent counseling and discussing with the patient about the diagnoses as above      Follow-up Disposition:  Return in about 3 months (around 06/30/2016).    Continue current medications and treatment plan  Maintain prudent diet and exercise regimen  Routine health maintenance for age recommendations shared  Call or return earlier for any additional concerns. Call 911 or go to ER if urgent symptoms develop in the interim.      Current Outpatient Prescriptions:   ???  buPROPion XL (WELLBUTRIN XL) 150 mg tablet, Take 1 Tab by mouth every morning., Disp: 90 Tab, Rfl: 1  ???  sertraline (ZOLOFT) 100 mg tablet, 2 qd, Disp: 180 Tab, Rfl: 12  ???  pravastatin (PRAVACHOL) 20 mg tablet, Take 1 Tab by mouth nightly., Disp: 90 Tab, Rfl: 12  ???  canagliflozin-metformin (INVOKAMET XR) 150-500 mg TBph, Take 1 Tab by mouth daily. Indications: type 2 diabetes mellitus, Disp: 90 Tab, Rfl: 12  ???  benazepril (LOTENSIN) 40 mg tablet, Take 1 Tab by mouth daily. Indications: hypertension, Disp: 90 Tab, Rfl: 12  ???  amLODIPine (NORVASC) 5 mg tablet, 1 qd  Indications: hypertension, Disp: 90 Tab, Rfl: 9  ???  exenatide microspheres (BYDUREON) 2 mg/0.65 mL pnij, 2 mg by SubCUTAneous route every seven (7) days., Disp: 12 Pen, Rfl: 12  ???  cyanocobalamin (VITAMIN B-12) 1,000 mcg tablet, Take 1,000 mcg by mouth daily., Disp: , Rfl:     [place] Yepiz L. Ernesto Rutherford, MD       (electronic signature)    [place] [address]

## 2016-04-27 ENCOUNTER — Ambulatory Visit
Admit: 2016-04-27 | Discharge: 2016-04-27 | Payer: PRIVATE HEALTH INSURANCE | Attending: Family Medicine | Primary: Family Medicine

## 2016-04-27 DIAGNOSIS — L42 Pityriasis rosea: Secondary | ICD-10-CM

## 2016-04-27 MED ORDER — TRIAMCINOLONE ACETONIDE 0.5 % TOPICAL CREAM
0.5 % | CUTANEOUS | 5 refills | Status: DC
Start: 2016-04-27 — End: 2016-04-27

## 2016-04-27 MED ORDER — TRIAMCINOLONE ACETONIDE 0.5 % TOPICAL CREAM
0.5 % | CUTANEOUS | 5 refills | Status: AC
Start: 2016-04-27 — End: ?

## 2016-04-27 NOTE — Progress Notes (Signed)
04/27/2016      Melinda Gregory  1966-03-30  [place] [address]  [phone] (home)       No chief complaint on file.        SUBJECTIVE     HPI:  Melinda Gregory is a 50 y.o. female here for eval of:     1 week of slightly itching ovoid rash on trunk    ROS    Review of Systems is otherwise negative except as above.    Current Outpatient Prescriptions on File Prior to Visit   Medication Sig Dispense Refill   ??? buPROPion XL (WELLBUTRIN XL) 150 mg tablet Take 1 Tab by mouth every morning. 90 Tab 1   ??? sertraline (ZOLOFT) 100 mg tablet 2 qd 180 Tab 12   ??? pravastatin (PRAVACHOL) 20 mg tablet Take 1 Tab by mouth nightly. 90 Tab 12   ??? canagliflozin-metformin (INVOKAMET XR) 150-500 mg TBph Take 1 Tab by mouth daily. Indications: type 2 diabetes mellitus 90 Tab 12   ??? benazepril (LOTENSIN) 40 mg tablet Take 1 Tab by mouth daily. Indications: hypertension 90 Tab 12   ??? amLODIPine (NORVASC) 5 mg tablet 1 qd  Indications: hypertension 90 Tab 9   ??? exenatide microspheres (BYDUREON) 2 mg/0.65 mL pnij 2 mg by SubCUTAneous route every seven (7) days. 12 Pen 12   ??? cyanocobalamin (VITAMIN B-12) 1,000 mcg tablet Take 1,000 mcg by mouth daily.       No current facility-administered medications on file prior to visit.        Allergies   Allergen Reactions   ??? Aspirin Hives   ??? Morphine Other (comments)     Drops BP   ??? Pcn [Penicillins] Hives   ??? Penicillin G Unknown (comments)     Potassium       Past Medical History:   Diagnosis Date   ??? Diabetes ([place])    ??? Hyperlipidemia    ??? Hypertension    ??? Morbid obesity ([place])        Past Surgical History:   Procedure Laterality Date   ??? HX COLONOSCOPY  2015    no polyps   ??? HX CYSTECTOMY  1991    Partial - foot   ??? HX GI  1996    Lap-band     starting weight 280, lost 100 lbs but regained   ??? HX OTHER SURGICAL  1989    Bypass       Family History   Problem Relation Age of Onset   ??? Diabetes Mother    ??? Asthma Mother    ??? Diabetes Father     ??? Diabetes Brother    ??? Diabetes Paternal Grandmother         OBJECTIVE       LABS/RADS  Results for orders placed or performed in visit on 12/10/15   CBC, POC   Result Value Ref Range    WBC 7.4 4.0 - 11.0 K/uL    RBC 5.58 (H) 3.80 - 5.40 M/uL    HGB 14.0 12.0 - 16.0 g/dL    HCT 45.1 35.0 - 48.0 %    MCV 81 80 - 100 fL    MCH 25.1 (L) 27.0 - 34.0 pg    MCHC 31.1 31.0 - 36.0 g/dL    PLATELET 214 140 - 440 K/uL    LYMPHOCYTES 14.1 (L) 20.5 - 51.1 %    MONOCYTES 3.5 3.0 - 10.0 %  GRANULOCYTES 82.4 37.0 - 92.0 %    ABS. LYMPHOCYTES 1.0 0.7 - 3.1 K/uL    ABS. MONOCYTES 0.2 0.1 - 1.1 K/uL    ABS. GRANULOCYTES 6.2 2.0 - 7.8 K/uL    RDW 14.9 11.0 - 16.0 %    MEAN PLATELET VOLUME 7.4 6.9 - 16.1 fL   METABOLIC PANEL, COMPREHENSIVE   Result Value Ref Range    Glucose 157 (H) 65 - 99 mg/dL    BUN 13 6 - 24 mg/dL    Creatinine 1.08 (H) 0.57 - 1.00 mg/dL    GFR est non-AA 60 >59 mL/min/1.73    GFR est AA 69 >59 mL/min/1.73    BUN/Creatinine ratio 12 9 - 23    Sodium 143 134 - 144 mmol/L    Potassium 4.2 3.5 - 5.2 mmol/L    Chloride 100 96 - 106 mmol/L    CO2 23 18 - 29 mmol/L    Calcium 9.4 8.7 - 10.2 mg/dL    Protein, total 7.1 6.0 - 8.5 g/dL    Albumin 4.2 3.5 - 5.5 g/dL    GLOBULIN, TOTAL 2.9 1.5 - 4.5 g/dL    A-G Ratio 1.4 1.2 - 2.2    Bilirubin, total 0.5 0.0 - 1.2 mg/dL    Alk. phosphatase 82 39 - 117 IU/L    AST (SGOT) 11 0 - 40 IU/L    ALT (SGPT) 14 0 - 32 IU/L   LIPID PANEL WITH LDL/HDL RATIO   Result Value Ref Range    Cholesterol, total 199 100 - 199 mg/dL    Triglyceride 120 0 - 149 mg/dL    HDL Cholesterol 47 >39 mg/dL    VLDL, calculated 24 5 - 40 mg/dL    LDL, calculated 128 (H) 0 - 99 mg/dL    LDL/HDL Ratio 2.7 0.0 - 3.2 ratio units   TSH 3RD GENERATION   Result Value Ref Range    TSH 3.260 0.450 - 4.500 uIU/mL   HEMOGLOBIN A1C WITH EAG   Result Value Ref Range    Hemoglobin A1c 6.3 (H) 4.8 - 5.6 %    Estimated average glucose 134 mg/dL       PHYSICAL EXAM  Visit Vitals    ??? BP 127/75 (BP 1 Location: Left arm, BP Patient Position: Sitting)   ??? Pulse 83   ??? Temp 98 ??F (36.7 ??C) (Oral)   ??? Wt 275 lb 9.6 oz (125 kg)   ??? BMI 45.86 kg/m2       Visual Acuity: No exam data present  Physical Exam   Skin:   Pityriasis rosea type rash       ASSESSMENT / PLAN   1. Pityriasis rosea  reassured  - triamcinolone (ARISTOCORT) 0.5 % topical cream; Apply thin film bid to tid prn  Dispense: 45 g; Refill: 5    Greater than 50 % of this 15 minute visit was spent counseling and discussing with the patient about the diagnoses as above      Follow-up Disposition:  Return in about 1 month (around 05/28/2016) for if not better, call or return earlier if worsens.    Continue current medications and treatment plan  Maintain prudent diet and exercise regimen  Routine health maintenance for age recommendations shared  Call or return earlier for any additional concerns. Call 911 or go to ER if urgent symptoms develop in the interim.      Current Outpatient Prescriptions:   ???  triamcinolone (ARISTOCORT) 0.5 % topical cream,  Apply thin film bid to tid prn, Disp: 45 g, Rfl: 5  ???  buPROPion XL (WELLBUTRIN XL) 150 mg tablet, Take 1 Tab by mouth every morning., Disp: 90 Tab, Rfl: 1  ???  sertraline (ZOLOFT) 100 mg tablet, 2 qd, Disp: 180 Tab, Rfl: 12  ???  pravastatin (PRAVACHOL) 20 mg tablet, Take 1 Tab by mouth nightly., Disp: 90 Tab, Rfl: 12  ???  canagliflozin-metformin (INVOKAMET XR) 150-500 mg TBph, Take 1 Tab by mouth daily. Indications: type 2 diabetes mellitus, Disp: 90 Tab, Rfl: 12  ???  benazepril (LOTENSIN) 40 mg tablet, Take 1 Tab by mouth daily. Indications: hypertension, Disp: 90 Tab, Rfl: 12  ???  amLODIPine (NORVASC) 5 mg tablet, 1 qd  Indications: hypertension, Disp: 90 Tab, Rfl: 9  ???  exenatide microspheres (BYDUREON) 2 mg/0.65 mL pnij, 2 mg by SubCUTAneous route every seven (7) days., Disp: 12 Pen, Rfl: 12  ???  cyanocobalamin (VITAMIN B-12) 1,000 mcg tablet, Take 1,000 mcg by mouth daily., Disp: , Rfl:      [place] L. Ernesto Rutherford, MD       (electronic signature)    [place] [address]

## 2016-06-03 ENCOUNTER — Other Ambulatory Visit: Admit: 2016-06-03 | Discharge: 2016-06-03 | Payer: BLUE CROSS/BLUE SHIELD | Primary: Family Medicine

## 2016-06-03 ENCOUNTER — Encounter

## 2016-06-03 DIAGNOSIS — Z0000 Encounter for general adult medical examination without abnormal findings: Secondary | ICD-10-CM

## 2016-06-03 LAB — CBC, POC
ABS. GRANULOCYTES: 5.6 10*3/uL (ref 2.0–7.8)
ABS. LYMPHOCYTES: 0.9 10*3/uL (ref 0.7–3.1)
ABS. MONOCYTES: 0.1 10*3/uL (ref 0.1–1.1)
GRANULOCYTES: 83.6 % (ref 37.0–92.0)
HCT: 44 % (ref 35.0–48.0)
HGB: 14.1 g/dL (ref 12.0–16.0)
LYMPHOCYTES: 13.7 % — ABNORMAL LOW (ref 20.5–51.1)
MCH: 25.6 pg — ABNORMAL LOW (ref 27.0–34.0)
MCHC: 32.1 g/dL (ref 31.0–36.0)
MCV: 80 fL (ref 80–100)
MEAN PLATELET VOLUME: 7.7 fL (ref 6.9–10.4)
MONOCYTES: 2.7 % — ABNORMAL LOW (ref 3.0–10.0)
PLATELET: 220 10*3/uL (ref 140–440)
RBC: 5.53 M/uL — ABNORMAL HIGH (ref 3.80–5.40)
RDW: 15.5 % (ref 11.0–16.0)
WBC: 6.6 10*3/uL (ref 4.0–11.0)

## 2016-06-03 NOTE — Addendum Note (Signed)
Addended by: Gerhard PerchesLINCOLN, Ticia Virgo on: 06/03/2016 12:52 PM      Modules accepted: Orders

## 2016-06-03 NOTE — Progress Notes (Signed)
Labs drawn 06/03/16

## 2016-06-04 LAB — METABOLIC PANEL, COMPREHENSIVE
A-G Ratio: 1.5 (ref 1.2–2.2)
ALT (SGPT): 12 IU/L (ref 0–32)
AST (SGOT): 15 IU/L (ref 0–40)
Albumin: 4.2 g/dL (ref 3.5–5.5)
Alk. phosphatase: 78 IU/L (ref 39–117)
BUN/Creatinine ratio: 13 (ref 9–23)
BUN: 14 mg/dL (ref 6–24)
Bilirubin, total: 0.5 mg/dL (ref 0.0–1.2)
CO2: 26 mmol/L (ref 18–29)
Calcium: 9.1 mg/dL (ref 8.7–10.2)
Chloride: 103 mmol/L (ref 96–106)
Creatinine: 1.04 mg/dL — ABNORMAL HIGH (ref 0.57–1.00)
GFR est AA: 71 mL/min/{1.73_m2} (ref >59)
GFR est non-AA: 62 mL/min/{1.73_m2} (ref >59)
GLOBULIN, TOTAL: 2.8 g/dL (ref 1.5–4.5)
Glucose: 111 mg/dL — ABNORMAL HIGH (ref 65–99)
Potassium: 4.1 mmol/L (ref 3.5–5.2)
Protein, total: 7 g/dL (ref 6.0–8.5)
Sodium: 145 mmol/L — ABNORMAL HIGH (ref 134–144)

## 2016-06-04 LAB — LIPID PANEL WITH LDL/HDL RATIO
Cholesterol, total: 189 mg/dL (ref 100–199)
HDL Cholesterol: 53 mg/dL (ref >39)
LDL, calculated: 118 mg/dL — ABNORMAL HIGH (ref 0–99)
LDL/HDL Ratio: 2.2 ratio units (ref 0.0–3.2)
Triglyceride: 88 mg/dL (ref 0–149)
VLDL, calculated: 18 mg/dL (ref 5–40)

## 2016-06-04 LAB — TSH 3RD GENERATION: TSH: 3.33 u[IU]/mL (ref 0.450–4.500)

## 2016-06-04 LAB — HEMOGLOBIN A1C WITH EAG
Estimated average glucose: 128 mg/dL
Hemoglobin A1c: 6.1 % — ABNORMAL HIGH (ref 4.8–5.6)

## 2016-06-06 NOTE — Progress Notes (Signed)
Print out labs to send to pt.

## 2016-06-13 ENCOUNTER — Ambulatory Visit
Admit: 2016-06-13 | Discharge: 2016-06-13 | Payer: BLUE CROSS/BLUE SHIELD | Attending: Family Medicine | Primary: Family Medicine

## 2016-06-13 DIAGNOSIS — Z0000 Encounter for general adult medical examination without abnormal findings: Secondary | ICD-10-CM

## 2016-06-13 LAB — URINALYSIS W/MICROSCOPIC
Bilirubin: NEGATIVE mg/dL
Glucose: >=1000 mg/dL — AB
Ketone: NEGATIVE mg/dL
Nitrites: NEGATIVE
Protein: NEGATIVE mg/dL
Specific gravity: 1.015 (ref 1.000–1.030)
Urobilinogen: 0.2 E.U./dl (ref 0.2–1.0)
pH (UA): 5.5 (ref 5.0–8.0)

## 2016-06-13 LAB — URINE, MICROALBUMIN, SEMIQUANTITATIVE: Microalbumin, Urine: 20 mg/L (ref 0–20)

## 2016-06-13 MED ORDER — SERTRALINE 100 MG TAB
100 mg | ORAL_TABLET | ORAL | 12 refills | Status: AC
Start: 2016-06-13 — End: ?

## 2016-06-13 MED ORDER — BUPROPION XL 150 MG 24 HR TAB
150 mg | ORAL_TABLET | ORAL | 12 refills | Status: DC
Start: 2016-06-13 — End: 2016-08-09

## 2016-06-13 MED ORDER — CANAGLIFLOZIN 150 MG-METFORMIN ER 500 MG TABLET,EXTENDED RELEASE 24 HR
150-500 mg | ORAL_TABLET | ORAL | 12 refills | Status: DC
Start: 2016-06-13 — End: 2016-12-07

## 2016-06-13 MED ORDER — AMLODIPINE 5 MG TAB
5 mg | ORAL_TABLET | ORAL | 9 refills | Status: AC
Start: 2016-06-13 — End: ?

## 2016-06-13 MED ORDER — PRAVASTATIN 40 MG TAB
40 mg | ORAL_TABLET | ORAL | 12 refills | Status: AC
Start: 2016-06-13 — End: ?

## 2016-06-13 MED ORDER — EXENATIDE ER 2 MG/0.65 ML SUBCUTANEOUS PEN INJECTOR
2 mg/0.65 mL | PEN_INJECTOR | SUBCUTANEOUS | 12 refills | Status: AC
Start: 2016-06-13 — End: ?

## 2016-06-13 MED ORDER — BENAZEPRIL 40 MG TAB
40 mg | ORAL_TABLET | ORAL | 12 refills | Status: AC
Start: 2016-06-13 — End: ?

## 2016-06-13 NOTE — Progress Notes (Signed)
06/13/2016      Melinda Gregory  1966-01-04  [address] [place] [address]  [phone] (home)       Chief Complaint   Patient presents with   ??? Complete Physical         SUBJECTIVE     HPI:  Melinda Gregory is a 51 y.o. female here for eval of:       For CPX  Overall doing very well. Remains active both physically and mentally.    AODM: Recheck of diabetes. Stable on current regimen. Sts Blood sugar typically less than 150. No hyperglycemic symptoms. No reports of hypoglycemic episodes.  No reports of adverse drug interactions. Remains compliant with follow-up.    Hypertension: recheck of HTN. Doing well. Remains compliant with medications. No chest pain, SOB, edema or dizziness. States that normotensive at home.     Lipids: Doing well. Remains compliant with medications. No joint or muscle pain. No jaundice. No adverse effects    Depression: doing well on current medications. Feels that depressive symptoms are controlled. No mood swings, anxiety, no thoughts of harm to self or to others.    No chest pain, SOB or change in bowel or bladder habits  No other new concerns.    ROS    Review of Systems is otherwise negative except as above.    Current Outpatient Prescriptions on File Prior to Visit   Medication Sig Dispense Refill   ??? triamcinolone (ARISTOCORT) 0.5 % topical cream Apply thin film bid to tid prn 45 g 5   ??? [DISCONTINUED] buPROPion XL (WELLBUTRIN XL) 150 mg tablet Take 1 Tab by mouth every morning. 90 Tab 1   ??? [DISCONTINUED] sertraline (ZOLOFT) 100 mg tablet 2 qd 180 Tab 12   ??? [DISCONTINUED] pravastatin (PRAVACHOL) 20 mg tablet Take 1 Tab by mouth nightly. 90 Tab 12   ??? [DISCONTINUED] canagliflozin-metformin (INVOKAMET XR) 150-500 mg TBph Take 1 Tab by mouth daily. Indications: type 2 diabetes mellitus 90 Tab 12   ??? [DISCONTINUED] benazepril (LOTENSIN) 40 mg tablet Take 1 Tab by mouth daily. Indications: hypertension 90 Tab 12    ??? [DISCONTINUED] amLODIPine (NORVASC) 5 mg tablet 1 qd  Indications: hypertension 90 Tab 9   ??? [DISCONTINUED] exenatide microspheres (BYDUREON) 2 mg/0.65 mL pnij 2 mg by SubCUTAneous route every seven (7) days. 12 Pen 12   ??? cyanocobalamin (VITAMIN B-12) 1,000 mcg tablet Take 1,000 mcg by mouth daily.       No current facility-administered medications on file prior to visit.        Allergies   Allergen Reactions   ??? Aspirin Hives   ??? Morphine Other (comments)     Drops BP   ??? Pcn [Penicillins] Hives   ??? Penicillin G Unknown (comments)     Potassium       Past Medical History:   Diagnosis Date   ??? Diabetes ([facility])    ??? Hyperlipidemia    ??? Hypertension    ??? Morbid obesity ([facility])        Past Surgical History:   Procedure Laterality Date   ??? HX COLONOSCOPY  2015    no polyps   ??? HX CYSTECTOMY  1991    Partial - foot   ??? HX GI  1996    Lap-band     starting weight 280, lost 100 lbs but regained   ??? HX OTHER SURGICAL  1989    Bypass       Family  History   Problem Relation Age of Onset   ??? Diabetes Mother    ??? Asthma Mother    ??? Diabetes Father    ??? Diabetes Brother    ??? Diabetes Paternal Grandmother         OBJECTIVE       LABS/RADS  Results for orders placed or performed in visit on 06/13/16   URINALYSIS W/MICROSCOPIC   Result Value Ref Range    Color Yellow Straw-Dark Yellow    Appearance Clear Clear-Slightly Cloudy    Glucose >=1000 mg/dL (A) Negative mg/dL    Bilirubin Negative Negative mg/dL    Ketone Negative Negative mg/dL    Specific gravity 5.4091.015 1.000 - 1.030    Blood Small (A) Negative ERY/uL    pH (UA) 5.5 5.0 - 8.0    Protein Negative Negative mg/dL    Urobilinogen 0.2 E.U./dL 0.2 - 1.0 E.U./dl    Nitrites Negative Negative    Leukocyte Esterase Trace (A) Negative LEU/uL    WBC 0-2 0 - 2 /HPF    RBC 0-1 0 - 1 /HPF    URINE EPITHELIAL CELLS Rare Negatine-Many /HPF    Bacteria Trace (A) Negative /HPF   URINE, MICROALBUMIN, SEMIQUANTITATIVE   Result Value Ref Range    Microalbumin, Urine 20 0 - 20 mg/L        PHYSICAL EXAM  Visit Vitals   ??? BP 120/90 (BP 1 Location: Left arm, BP Patient Position: Sitting)   ??? Pulse (!) 109   ??? Temp 98.2 ??F (36.8 ??C) (Oral)   ??? Ht 5\' 3"  (1.6 m)   ??? Wt 274 lb 6.4 oz (124.5 kg)   ??? SpO2 98%   ??? BMI 48.61 kg/m2       Visual Acuity: Vision Screening Comments: Recent eye exam 2017  Physical Exam   Constitutional: She is oriented to person, place, and time and well-developed, well-nourished, and in no distress.   Morbid obesity   HENT:   Head: Normocephalic.   Right Ear: External ear normal.   Left Ear: External ear normal.   Mouth/Throat: Oropharynx is clear and moist.   Eyes: Conjunctivae and EOM are normal. Pupils are equal, round, and reactive to light.   Neck: Normal range of motion. Neck supple. No thyromegaly present.   Cardiovascular: Normal rate, regular rhythm and normal heart sounds.    No murmur heard.  Pulmonary/Chest: Effort normal and breath sounds normal. No respiratory distress. She has no wheezes. Right breast exhibits no inverted nipple, no mass, no nipple discharge, no skin change and no tenderness. Left breast exhibits no inverted nipple, no mass, no nipple discharge, no skin change and no tenderness.   Abdominal: Soft. Bowel sounds are normal. She exhibits no distension and no mass. There is no tenderness.   Genitourinary:   Genitourinary Comments: GU exam deferred/declined by patient   Musculoskeletal: Normal range of motion.   Neurological: She is alert and oriented to person, place, and time.   Skin: Skin is warm and dry.   Psychiatric: Mood, memory, affect and judgment normal.       ASSESSMENT / PLAN   1. General medical exam     - UA w/ Micro - POC ([id number])  - Urine Microalbumin, [place] POC ([id number])    2. Controlled type 2 diabetes mellitus without complication, without long-term current use of insulin ([facility])     - canagliflozin-metformin (INVOKAMET XR) 150-500 mg TBph; 1 every day for diabetes  Indications: type 2 diabetes mellitus  Dispense: 90 Tab; Refill: 12   - exenatide microspheres (BYDUREON) 2 mg/0.65 mL pnij; 2 mg by SubCUTAneous route every seven (7) days.  Dispense: 12 Pen; Refill: 12    3. Vitamin D deficiency       4. Benign essential HTN     - benazepril (LOTENSIN) 40 mg tablet; 1 every day for BP  Indications: hypertension  Dispense: 90 Tab; Refill: 12  - amLODIPine (NORVASC) 5 mg tablet; 1 every day for BP  Indications: hypertension  Dispense: 90 Tab; Refill: 9    5. Mixed hyperlipidemia     - pravastatin (PRAVACHOL) 40 mg tablet; 1 every day for cholesterol  Dispense: 90 Tab; Refill: 12    6. Morbid obesity with BMI of 45.0-49.9, adult ([facility]CC)  Continue efforts at weight loss through diet and modest exercise.    7. Mild episode of recurrent major depressive disorder ([facility])  Depression/anxiety/bipolar precautions: Pt cautioned to be alert for signs of worsening depression, agitation, irritability or thoughts of harm to self or to others. It these occur, patient understands to stop medication and notify this office. If significantly worsens, to go ER or [facility].      8. Depression, unspecified depression type  As above  - buPROPion XL (WELLBUTRIN XL) 150 mg tablet; Take 1 Tab by mouth every morning.  Dispense: 90 Tab; Refill: 12  - sertraline (ZOLOFT) 100 mg tablet; 2 qd  Dispense: 180 Tab; Refill: 12    9. Screening mammogram, encounter for     - Mammogram- Bilateral, Screen (G0202); Future    Greater than 50 % of this 25 minute visit was spent counseling and discussing with the patient about the diagnoses as above    Follow-up Disposition:  Return in about 6 months (around 12/11/2016) for Labs prior.    Continue current medications and treatment plan  Maintain prudent diet and exercise regimen  Routine health maintenance for age recommendations shared  Call or return earlier for any additional concerns. Call 911 or go to ER if urgent symptoms develop in the interim.      Current Outpatient Prescriptions:    ???  buPROPion XL (WELLBUTRIN XL) 150 mg tablet, Take 1 Tab by mouth every morning., Disp: 90 Tab, Rfl: 12  ???  sertraline (ZOLOFT) 100 mg tablet, 2 qd, Disp: 180 Tab, Rfl: 12  ???  pravastatin (PRAVACHOL) 40 mg tablet, 1 every day for cholesterol, Disp: 90 Tab, Rfl: 12  ???  canagliflozin-metformin (INVOKAMET XR) 150-500 mg TBph, 1 every day for diabetes  Indications: type 2 diabetes mellitus, Disp: 90 Tab, Rfl: 12  ???  benazepril (LOTENSIN) 40 mg tablet, 1 every day for BP  Indications: hypertension, Disp: 90 Tab, Rfl: 12  ???  amLODIPine (NORVASC) 5 mg tablet, 1 every day for BP  Indications: hypertension, Disp: 90 Tab, Rfl: 9  ???  exenatide microspheres (BYDUREON) 2 mg/0.65 mL pnij, 2 mg by SubCUTAneous route every seven (7) days., Disp: 12 Pen, Rfl: 12  ???  triamcinolone (ARISTOCORT) 0.5 % topical cream, Apply thin film bid to tid prn, Disp: 45 g, Rfl: 5  ???  cyanocobalamin (VITAMIN B-12) 1,000 mcg tablet, Take 1,000 mcg by mouth daily., Disp: , Rfl:     Demico Ploch L. Talmadge Chad, MD       (electronic signature)    [facility]  [address]  [place], [place] [address]

## 2016-06-14 ENCOUNTER — Inpatient Hospital Stay: Admit: 2016-06-14 | Payer: BLUE CROSS/BLUE SHIELD | Attending: Family Medicine | Primary: Family Medicine

## 2016-06-14 DIAGNOSIS — Z1231 Encounter for screening mammogram for malignant neoplasm of breast: Secondary | ICD-10-CM

## 2016-08-09 ENCOUNTER — Ambulatory Visit
Admit: 2016-08-09 | Discharge: 2016-08-09 | Payer: BLUE CROSS/BLUE SHIELD | Attending: Family Medicine | Primary: Family Medicine

## 2016-08-09 DIAGNOSIS — K529 Noninfective gastroenteritis and colitis, unspecified: Secondary | ICD-10-CM

## 2016-08-09 MED ORDER — BUPROPION XL 300 MG 24 HR TAB
300 mg | ORAL_TABLET | ORAL | 12 refills | Status: AC
Start: 2016-08-09 — End: ?

## 2016-08-09 MED ORDER — DIPHENOXYLATE-ATROPINE 2.5 MG-0.025 MG TAB
ORAL_TABLET | ORAL | 5 refills | Status: AC
Start: 2016-08-09 — End: ?

## 2016-08-09 NOTE — Progress Notes (Signed)
08/09/2016      Melinda Gregory  04-10-1966  [address] [place] [address] [address]  [phone] (home)       Chief Complaint   Patient presents with   ??? Diarrhea     ongoing for 3 weeks         SUBJECTIVE     HPI:  Melinda Gregory is a 51 y.o. female here for eval of:     Seen 5 days ago for diarrhea at [facility] and placed on cipro  A little better but still with frequent watery stools.    Depression: doing well on current medications. Feels that depressive symptoms are controlled. No mood swings, anxiety, no thoughts of harm to self or to others.      ROS    Review of Systems is otherwise negative except as above.    Current Outpatient Prescriptions on File Prior to Visit   Medication Sig Dispense Refill   ??? sertraline (ZOLOFT) 100 mg tablet 2 qd 180 Tab 12   ??? pravastatin (PRAVACHOL) 40 mg tablet 1 every day for cholesterol 90 Tab 12   ??? canagliflozin-metformin (INVOKAMET XR) 150-500 mg TBph 1 every day for diabetes  Indications: type 2 diabetes mellitus 90 Tab 12   ??? benazepril (LOTENSIN) 40 mg tablet 1 every day for BP  Indications: hypertension 90 Tab 12   ??? amLODIPine (NORVASC) 5 mg tablet 1 every day for BP  Indications: hypertension 90 Tab 9   ??? exenatide microspheres (BYDUREON) 2 mg/0.65 mL pnij 2 mg by SubCUTAneous route every seven (7) days. 12 Pen 12   ??? [DISCONTINUED] buPROPion XL (WELLBUTRIN XL) 150 mg tablet Take 1 Tab by mouth every morning. 90 Tab 12   ??? triamcinolone (ARISTOCORT) 0.5 % topical cream Apply thin film bid to tid prn 45 g 5   ??? cyanocobalamin (VITAMIN B-12) 1,000 mcg tablet Take 1,000 mcg by mouth daily.       No current facility-administered medications on file prior to visit.        Allergies   Allergen Reactions   ??? Aspirin Hives   ??? Morphine Other (comments)     Drops BP   ??? Pcn [Penicillins] Hives   ??? Penicillin G Unknown (comments)     Potassium       Past Medical History:   Diagnosis Date   ??? Diabetes (HCC)    ??? Hyperlipidemia    ??? Hypertension     ??? Morbid obesity (HCC)        Past Surgical History:   Procedure Laterality Date   ??? HX COLONOSCOPY  2015    no polyps   ??? HX CYSTECTOMY  1991    Partial - foot   ??? HX GI  1996    Lap-band     starting weight 280, lost 100 lbs but regained   ??? HX OTHER SURGICAL  1989    Bypass       Family History   Problem Relation Age of Onset   ??? Diabetes Mother    ??? Asthma Mother    ??? Diabetes Father    ??? Diabetes Brother    ??? Diabetes Paternal Grandmother         OBJECTIVE       LABS/RADS  Results for orders placed or performed in visit on 06/13/16   URINALYSIS W/MICROSCOPIC   Result Value Ref Range    Color Yellow Straw-Dark Yellow    Appearance Clear Clear-Slightly Cloudy  Glucose >=1000 mg/dL (A) Negative mg/dL    Bilirubin Negative Negative mg/dL    Ketone Negative Negative mg/dL    Specific gravity 1.610 1.000 - 1.030    Blood Small (A) Negative ERY/uL    pH (UA) 5.5 5.0 - 8.0    Protein Negative Negative mg/dL    Urobilinogen 0.2 E.U./dL 0.2 - 1.0 E.U./dl    Nitrites Negative Negative    Leukocyte Esterase Trace (A) Negative LEU/uL    WBC 0-2 0 - 2 /HPF    RBC 0-1 0 - 1 /HPF    URINE EPITHELIAL CELLS Rare Negatine-Many /HPF    Bacteria Trace (A) Negative /HPF   URINE, MICROALBUMIN, SEMIQUANTITATIVE   Result Value Ref Range    Microalbumin, Urine 20 0 - 20 mg/L       PHYSICAL EXAM  Visit Vitals   ??? BP 138/76 (BP 1 Location: Left arm, BP Patient Position: Sitting)   ??? Pulse 100   ??? Temp 97.8 ??F (36.6 ??C) (Oral)   ??? Resp 16   ??? Ht  (1.6 m)   ??? Wt 268 lb (121.6 kg)   ??? SpO2 98%   ??? BMI 47.47 kg/m2       Visual Acuity: No exam data present  Physical Exam   Constitutional: She is well-developed, well-nourished, and in no distress.   Morbid obesity     Cardiovascular: Normal rate, regular rhythm, normal heart sounds and intact distal pulses.    Pulmonary/Chest: Effort normal and breath sounds normal. No respiratory distress. She has no wheezes.   Abdominal: Soft. Bowel sounds are normal. She exhibits no distension.  There is no tenderness.   Skin: Skin is warm and dry.   Psychiatric: Affect and judgment normal.       ASSESSMENT / PLAN   1. Gastroenteritis  Use prn  Advance diet  - diphenoxylate-atropine (LOMOTIL) 2.5-0.025 mg per tablet; 1 to 2 qid prn diarrhea  Dispense: 30 Tab; Refill: 5    2. Mild episode of recurrent major depressive disorder (HCC)  continue  - buPROPion XL (WELLBUTRIN XL) 300 mg XL tablet; 1 every day for depression  Dispense: 90 Tab; Refill: 12    Greater than 50 % of this 25 minute visit was spent counseling and discussing with the patient about the diagnoses as above    Follow-up Disposition:  Return in about 1 week (around 08/16/2016) for if not better, call or return earlier if worsens.    Continue current medications and treatment plan  Maintain prudent diet and exercise regimen  Routine health maintenance for age recommendations shared  Call or return earlier for any additional concerns. Call 911 or go to ER if urgent symptoms develop in the interim.      Current Outpatient Prescriptions:   ???  diphenoxylate-atropine (LOMOTIL) 2.5-0.025 mg per tablet, 1 to 2 qid prn diarrhea, Disp: 30 Tab, Rfl: 5  ???  buPROPion XL (WELLBUTRIN XL) 300 mg XL tablet, 1 every day for depression, Disp: 90 Tab, Rfl: 12  ???  sertraline (ZOLOFT) 100 mg tablet, 2 qd, Disp: 180 Tab, Rfl: 12  ???  pravastatin (PRAVACHOL) 40 mg tablet, 1 every day for cholesterol, Disp: 90 Tab, Rfl: 12  ???  canagliflozin-metformin (INVOKAMET XR) 150-500 mg TBph, 1 every day for diabetes  Indications: type 2 diabetes mellitus, Disp: 90 Tab, Rfl: 12  ???  benazepril (LOTENSIN) 40 mg tablet, 1 every day for BP  Indications: hypertension, Disp: 90 Tab, Rfl: 12  ???  amLODIPine (NORVASC) 5 mg tablet, 1 every day for BP  Indications: hypertension, Disp: 90 Tab, Rfl: 9  ???  exenatide microspheres (BYDUREON) 2 mg/0.65 mL pnij, 2 mg by SubCUTAneous route every seven (7) days., Disp: 12 Pen, Rfl: 12   ???  triamcinolone (ARISTOCORT) 0.5 % topical cream, Apply thin film bid to tid prn, Disp: 45 g, Rfl: 5  ???  cyanocobalamin (VITAMIN B-12) 1,000 mcg tablet, Take 1,000 mcg by mouth daily., Disp: , Rfl:     Tsion Inghram L. Talmadge Chad, MD       (electronic signature)    [facility]  [address]  [place], [place] [address]

## 2016-08-15 NOTE — Telephone Encounter (Signed)
Pt has dexa scheduled for May 3rd, is there an "order" Linked?

## 2016-08-16 ENCOUNTER — Encounter

## 2016-08-25 ENCOUNTER — Encounter: Primary: Family Medicine

## 2016-08-31 ENCOUNTER — Encounter: Primary: Family Medicine

## 2016-09-08 ENCOUNTER — Encounter: Admit: 2016-09-08 | Discharge: 2016-09-08 | Payer: BLUE CROSS/BLUE SHIELD | Primary: Family Medicine

## 2016-09-08 DIAGNOSIS — M810 Age-related osteoporosis without current pathological fracture: Secondary | ICD-10-CM

## 2016-09-08 NOTE — Progress Notes (Signed)
Study preformed today by Jerrie Kamisha Ell

## 2016-09-14 ENCOUNTER — Encounter

## 2016-09-14 NOTE — Progress Notes (Signed)
DEXA: T Score -0.3  Continue diet and exercise. Recheck 3 years  jlb

## 2016-09-14 NOTE — Telephone Encounter (Signed)
Please see patient's message below sent via My Chart.     Dr Everardo BealsBridgeman had stated that if my symptoms continue then I will need to Make a lab appointment to pick up testing supplies for stool studies. I was wondering if I could come tomorrow, Sep 14, 2016? If not tomorrow what times are available for next week - afternoons are best for me. My cell is [phone] if there is availablilty tomorrow.     - Melinda Gregory

## 2016-09-15 NOTE — Telephone Encounter (Signed)
Patient picked up stools studies on 09/14/16. - jb

## 2016-09-19 NOTE — Addendum Note (Signed)
Addended by: Gerhard PerchesLINCOLN, Symeon Puleo on: 09/19/2016 09:22 AM      Modules accepted: Orders

## 2016-09-21 LAB — C DIFFICILE TOXIN A & B BY EIA: C. difficile Toxins A+B: NEGATIVE

## 2016-09-22 LAB — OVA & PARASITES, STOOL: Ova & Parasite exam: NONE SEEN

## 2016-09-23 LAB — CULTURE, STOOL: E coli Shiga toxin: NEGATIVE

## 2016-12-02 ENCOUNTER — Other Ambulatory Visit: Admit: 2016-12-02 | Discharge: 2016-12-02 | Payer: BLUE CROSS/BLUE SHIELD | Primary: Family Medicine

## 2016-12-02 ENCOUNTER — Encounter

## 2016-12-02 DIAGNOSIS — E119 Type 2 diabetes mellitus without complications: Secondary | ICD-10-CM

## 2016-12-02 LAB — CBC, POC
ABS. GRANULOCYTES: 5.1 10*3/uL (ref 2.0–7.8)
ABS. LYMPHOCYTES: 0.8 10*3/uL (ref 0.7–3.1)
ABS. MONOCYTES: 0.1 10*3/uL (ref 0.1–1.1)
GRANULOCYTES: 82.4 % (ref 37.0–92.0)
HCT: 43.4 % (ref 35.0–48.0)
HGB: 14.1 g/dL (ref 12.0–16.0)
LYMPHOCYTES: 14.8 % — ABNORMAL LOW (ref 20.5–51.1)
MCH: 27.2 pg (ref 27.0–34.0)
MCHC: 32.5 g/dL (ref 31.0–36.0)
MCV: 84 fL (ref 80–100)
MEAN PLATELET VOLUME: 7.4 fL (ref 6.9–10.4)
MONOCYTES: 2.8 % — ABNORMAL LOW (ref 3.0–10.0)
PLATELET: 216 10*3/uL (ref 140–440)
RBC: 5.2 M/uL (ref 3.80–5.40)
RDW: 15.2 % (ref 11.0–16.0)
WBC: 6 10*3/uL (ref 4.0–11.0)

## 2016-12-03 LAB — METABOLIC PANEL, COMPREHENSIVE
A-G Ratio: 1.8 (ref 1.2–2.2)
ALT (SGPT): 7 IU/L (ref 0–32)
AST (SGOT): 17 IU/L (ref 0–40)
Albumin: 4.2 g/dL (ref 3.5–5.5)
Alk. phosphatase: 80 IU/L (ref 39–117)
BUN/Creatinine ratio: 8 — ABNORMAL LOW (ref 9–23)
BUN: 9 mg/dL (ref 6–24)
Bilirubin, total: 0.5 mg/dL (ref 0.0–1.2)
CO2: 21 mmol/L (ref 20–29)
Calcium: 9.1 mg/dL (ref 8.7–10.2)
Chloride: 104 mmol/L (ref 96–106)
Creatinine: 1.07 mg/dL — ABNORMAL HIGH (ref 0.57–1.00)
GFR est AA: 69 mL/min/{1.73_m2} (ref >59)
GFR est non-AA: 60 mL/min/{1.73_m2} (ref >59)
GLOBULIN, TOTAL: 2.4 g/dL (ref 1.5–4.5)
Glucose: 124 mg/dL — ABNORMAL HIGH (ref 65–99)
Potassium: 4.1 mmol/L (ref 3.5–5.2)
Protein, total: 6.6 g/dL (ref 6.0–8.5)
Sodium: 143 mmol/L (ref 134–144)

## 2016-12-03 LAB — LIPID PANEL WITH LDL/HDL RATIO
Cholesterol, total: 186 mg/dL (ref 100–199)
HDL Cholesterol: 47 mg/dL (ref >39)
LDL, calculated: 118 mg/dL — ABNORMAL HIGH (ref 0–99)
LDL/HDL Ratio: 2.5 ratio (ref 0.0–3.2)
Triglyceride: 104 mg/dL (ref 0–149)
VLDL, calculated: 21 mg/dL (ref 5–40)

## 2016-12-03 LAB — HEMOGLOBIN A1C WITH EAG
Estimated average glucose: 123 mg/dL
Hemoglobin A1c: 5.9 % — ABNORMAL HIGH (ref 4.8–5.6)

## 2016-12-03 LAB — TSH 3RD GENERATION: TSH: 5.19 u[IU]/mL — ABNORMAL HIGH (ref 0.450–4.500)

## 2016-12-05 NOTE — Progress Notes (Signed)
Print out labs to send to pt.

## 2016-12-07 ENCOUNTER — Ambulatory Visit
Admit: 2016-12-07 | Discharge: 2016-12-07 | Payer: BLUE CROSS/BLUE SHIELD | Attending: Family Medicine | Primary: Family Medicine

## 2016-12-07 DIAGNOSIS — E119 Type 2 diabetes mellitus without complications: Secondary | ICD-10-CM

## 2016-12-07 MED ORDER — METOPROLOL SUCCINATE SR 100 MG 24 HR TAB
100 mg | ORAL_TABLET | ORAL | 12 refills | Status: AC
Start: 2016-12-07 — End: ?

## 2016-12-07 MED ORDER — CANAGLIFLOZIN 300 MG TABLET
300 mg | ORAL_TABLET | ORAL | 12 refills | Status: AC
Start: 2016-12-07 — End: ?

## 2016-12-07 MED ORDER — LEVOTHYROXINE 75 MCG TAB
75 mcg | ORAL_TABLET | ORAL | 12 refills | Status: AC
Start: 2016-12-07 — End: ?

## 2016-12-07 NOTE — Progress Notes (Signed)
12/07/2016      Melinda Gregory  September 06, 1965  [place] [address]  [phone] (home)       Chief Complaint   Patient presents with   ??? Follow-up     6 month rc, discuss labs          SUBJECTIVE     HPI:  Melinda Gregory is a 51 y.o. female here for eval of:     AODM: controlled but continues to have diarrhea.    BP: still elevated, sometimes feels that heart rate momentarily increases for several seconds, never sustained. NO chest pain or SOB    Depression: doing well on current medications. Feels that depressive symptoms are controlled. No mood swings, anxiety, no thoughts of harm to self or to others.    Lipids: Doing well. Remains compliant with medications. No joint or muscle pain. No jaundice. No adverse effects    No chest pain, SOB or change in bowel or bladder habits  No other new concerns.    ROS    Review of Systems is otherwise negative except as above.    Current Outpatient Prescriptions on File Prior to Visit   Medication Sig Dispense Refill   ??? diphenoxylate-atropine (LOMOTIL) 2.5-0.025 mg per tablet 1 to 2 qid prn diarrhea 30 Tab 5   ??? buPROPion XL (WELLBUTRIN XL) 300 mg XL tablet 1 every day for depression 90 Tab 12   ??? sertraline (ZOLOFT) 100 mg tablet 2 qd 180 Tab 12   ??? pravastatin (PRAVACHOL) 40 mg tablet 1 every day for cholesterol 90 Tab 12   ??? benazepril (LOTENSIN) 40 mg tablet 1 every day for BP  Indications: hypertension 90 Tab 12   ??? amLODIPine (NORVASC) 5 mg tablet 1 every day for BP  Indications: hypertension 90 Tab 9   ??? exenatide microspheres (BYDUREON) 2 mg/0.65 mL pnij 2 mg by SubCUTAneous route every seven (7) days. 12 Pen 12   ??? triamcinolone (ARISTOCORT) 0.5 % topical cream Apply thin film bid to tid prn 45 g 5   ??? cyanocobalamin (VITAMIN B-12) 1,000 mcg tablet Take 1,000 mcg by mouth daily.       No current facility-administered medications on file prior to visit.        Allergies   Allergen Reactions   ??? Aspirin Hives    ??? Morphine Other (comments)     Drops BP   ??? Pcn [Penicillins] Hives   ??? Penicillin G Unknown (comments)     Potassium       Past Medical History:   Diagnosis Date   ??? Diabetes ([place])    ??? Hyperlipidemia    ??? Hypertension    ??? Morbid obesity ([place])        Past Surgical History:   Procedure Laterality Date   ??? HX COLONOSCOPY  2015    no polyps   ??? HX CYSTECTOMY  1991    Partial - foot   ??? HX GI  1996    Lap-band     starting weight 280, lost 100 lbs but regained   ??? HX OTHER SURGICAL  1989    Bypass       Family History   Problem Relation Age of Onset   ??? Diabetes Mother    ??? Asthma Mother    ??? Diabetes Father    ??? Diabetes Brother    ??? Diabetes Paternal Grandmother         OBJECTIVE  LABS/RADS  Results for orders placed or performed in visit on 12/02/16   CBC, POC   Result Value Ref Range    WBC 6.0 4.0 - 11.0 K/uL    RBC 5.20 3.80 - 5.40 M/uL    HGB 14.1 12.0 - 16.0 g/dL    HCT 43.4 35.0 - 48.0 %    MCV 84 80 - 100 fL    MCH 27.2 27.0 - 34.0 pg    MCHC 32.5 31.0 - 36.0 g/dL    PLATELET 216 140 - 440 K/uL    LYMPHOCYTES 14.8 (L) 20.5 - 51.1 %    MONOCYTES 2.8 (L) 3.0 - 10.0 %    GRANULOCYTES 82.4 37.0 - 92.0 %    ABS. LYMPHOCYTES 0.8 0.7 - 3.1 K/uL    ABS. MONOCYTES 0.1 0.1 - 1.1 K/uL    ABS. GRANULOCYTES 5.1 2.0 - 7.8 K/uL    RDW 15.2 11.0 - 16.0 %    MEAN PLATELET VOLUME 7.4 6.9 - 18.2 fL   METABOLIC PANEL, COMPREHENSIVE   Result Value Ref Range    Glucose 124 (H) 65 - 99 mg/dL    BUN 9 6 - 24 mg/dL    Creatinine 1.07 (H) 0.57 - 1.00 mg/dL    GFR est non-AA 60 >59 mL/min/1.73    GFR est AA 69 >59 mL/min/1.73    BUN/Creatinine ratio 8 (L) 9 - 23    Sodium 143 134 - 144 mmol/L    Potassium 4.1 3.5 - 5.2 mmol/L    Chloride 104 96 - 106 mmol/L    CO2 21 20 - 29 mmol/L    Calcium 9.1 8.7 - 10.2 mg/dL    Protein, total 6.6 6.0 - 8.5 g/dL    Albumin 4.2 3.5 - 5.5 g/dL    GLOBULIN, TOTAL 2.4 1.5 - 4.5 g/dL    A-G Ratio 1.8 1.2 - 2.2    Bilirubin, total 0.5 0.0 - 1.2 mg/dL    Alk. phosphatase 80 39 - 117 IU/L     AST (SGOT) 17 0 - 40 IU/L    ALT (SGPT) 7 0 - 32 IU/L   LIPID PANEL WITH LDL/HDL RATIO   Result Value Ref Range    Cholesterol, total 186 100 - 199 mg/dL    Triglyceride 104 0 - 149 mg/dL    HDL Cholesterol 47 >39 mg/dL    VLDL, calculated 21 5 - 40 mg/dL    LDL, calculated 118 (H) 0 - 99 mg/dL    LDL/HDL Ratio 2.5 0.0 - 3.2 ratio   TSH 3RD GENERATION   Result Value Ref Range    TSH 5.190 (H) 0.450 - 4.500 uIU/mL   HEMOGLOBIN A1C WITH EAG   Result Value Ref Range    Hemoglobin A1c 5.9 (H) 4.8 - 5.6 %    Estimated average glucose 123 mg/dL       PHYSICAL EXAM  Visit Vitals   ??? BP (!) 171/97 (BP 1 Location: Left arm, BP Patient Position: Sitting)   ??? Pulse (!) 104   ??? Temp 98 ??F (36.7 ??C) (Oral)   ??? Ht '5\' 3"'$  (1.6 m)   ??? Wt 274 lb 12.8 oz (124.6 kg)   ??? SpO2 98%   ??? BMI 48.68 kg/m2       Visual Acuity: No exam data present  Physical Exam   Constitutional: She is oriented to person, place, and time and well-developed, well-nourished, and in no distress.   Morbid obesity     HENT:  Head: Normocephalic.   Eyes: Conjunctivae and EOM are normal. Pupils are equal, round, and reactive to light.   Neck: Normal range of motion. Neck supple.   Cardiovascular: Normal rate, regular rhythm and normal heart sounds.    Pulmonary/Chest: Effort normal and breath sounds normal.   Abdominal: Soft. Bowel sounds are normal.   Musculoskeletal: Normal range of motion.   Neurological: She is alert and oriented to person, place, and time.   Skin: Skin is warm and dry.   Psychiatric: Mood, memory, affect and judgment normal.       ASSESSMENT / PLAN   1. Controlled type 2 diabetes mellitus without complication, without long-term current use of insulin (HCC)  Change Invokamet to plain Invokana to see if helps with diarrhea. Continue Bydureon  - canagliflozin (INVOKANA) 300 mg tablet; 1 every day for diabetes  Dispense: 90 Tab; Refill: 12    2. Benign essential HTN  Add metoprolol for BP and rate control   - metoprolol succinate (TOPROL-XL) 100 mg tablet; 1 every day for BP  Indications: hypertension  Dispense: 90 Tab; Refill: 12    3. Mixed hyperlipidemia  controlled    4. Mild episode of recurrent major depressive disorder ([place])  improved    5. Acquired hypothyroidism  begin  - levothyroxine (SYNTHROID) 75 mcg tablet; 1 every day for thyroid replacement  Indications: hypothyroidism  Dispense: 90 Tab; Refill: 12    Greater than 50 % of this 25 minute visit was spent counseling and discussing with the patient about the diagnoses as above    Follow-up Disposition:  Return in about 6 months (around 06/09/2017) for Physical/AWV.    Continue current medications and treatment plan  Maintain prudent diet and exercise regimen  Routine health maintenance for age recommendations shared  Call or return earlier for any additional concerns. Call 911 or go to ER if urgent symptoms develop in the interim.      Current Outpatient Prescriptions:   ???  canagliflozin (INVOKANA) 300 mg tablet, 1 every day for diabetes, Disp: 90 Tab, Rfl: 12  ???  metoprolol succinate (TOPROL-XL) 100 mg tablet, 1 every day for BP  Indications: hypertension, Disp: 90 Tab, Rfl: 12  ???  levothyroxine (SYNTHROID) 75 mcg tablet, 1 every day for thyroid replacement  Indications: hypothyroidism, Disp: 90 Tab, Rfl: 12  ???  diphenoxylate-atropine (LOMOTIL) 2.5-0.025 mg per tablet, 1 to 2 qid prn diarrhea, Disp: 30 Tab, Rfl: 5  ???  buPROPion XL (WELLBUTRIN XL) 300 mg XL tablet, 1 every day for depression, Disp: 90 Tab, Rfl: 12  ???  sertraline (ZOLOFT) 100 mg tablet, 2 qd, Disp: 180 Tab, Rfl: 12  ???  pravastatin (PRAVACHOL) 40 mg tablet, 1 every day for cholesterol, Disp: 90 Tab, Rfl: 12  ???  benazepril (LOTENSIN) 40 mg tablet, 1 every day for BP  Indications: hypertension, Disp: 90 Tab, Rfl: 12  ???  amLODIPine (NORVASC) 5 mg tablet, 1 every day for BP  Indications: hypertension, Disp: 90 Tab, Rfl: 9  ???  exenatide microspheres (BYDUREON) 2 mg/0.65 mL pnij, 2 mg by  SubCUTAneous route every seven (7) days., Disp: 12 Pen, Rfl: 12  ???  triamcinolone (ARISTOCORT) 0.5 % topical cream, Apply thin film bid to tid prn, Disp: 45 g, Rfl: 5  ???  cyanocobalamin (VITAMIN B-12) 1,000 mcg tablet, Take 1,000 mcg by mouth daily., Disp: , Rfl:     Omeka Holben L. Ernesto Rutherford, MD       (electronic signature)    [place] [address]

## 2017-06-05 ENCOUNTER — Encounter: Primary: Family Medicine

## 2017-06-12 ENCOUNTER — Encounter: Attending: Family Medicine | Primary: Family Medicine

## 2017-07-08 ENCOUNTER — Encounter

## 2017-07-18 ENCOUNTER — Encounter: Primary: Family Medicine

## 2017-07-19 ENCOUNTER — Encounter: Primary: Family Medicine

## 2017-07-25 ENCOUNTER — Encounter: Attending: Family Medicine | Primary: Family Medicine

## 2017-08-02 ENCOUNTER — Encounter: Attending: Family Medicine | Primary: Family Medicine

## 2023-10-12 ENCOUNTER — Encounter (INDEPENDENT_AMBULATORY_CARE_PROVIDER_SITE_OTHER): Payer: Self-pay | Admitting: *Deleted
